# Patient Record
Sex: Female | Born: 2000
Health system: Southern US, Community
[De-identification: ages and names within clinical notes are randomized; demographics above are authoritative.]

## PROBLEM LIST (undated history)

## (undated) ENCOUNTER — Emergency Department (HOSPITAL_COMMUNITY): Admission: EM | Payer: 59

## (undated) HISTORY — PX: NO PAST SURGERIES: SHX2092

---
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
## 2021-01-08 DIAGNOSIS — Z419 Encounter for procedure for purposes other than remedying health state, unspecified: Secondary | ICD-10-CM | POA: Diagnosis not present

## 2021-02-08 DIAGNOSIS — Z419 Encounter for procedure for purposes other than remedying health state, unspecified: Secondary | ICD-10-CM | POA: Diagnosis not present

## 2021-03-10 DIAGNOSIS — Z419 Encounter for procedure for purposes other than remedying health state, unspecified: Secondary | ICD-10-CM | POA: Diagnosis not present

## 2021-04-10 DIAGNOSIS — Z419 Encounter for procedure for purposes other than remedying health state, unspecified: Secondary | ICD-10-CM | POA: Diagnosis not present

## 2021-05-05 ENCOUNTER — Other Ambulatory Visit: Payer: Self-pay

## 2021-05-05 ENCOUNTER — Encounter (HOSPITAL_BASED_OUTPATIENT_CLINIC_OR_DEPARTMENT_OTHER): Payer: Self-pay

## 2021-05-05 ENCOUNTER — Emergency Department (HOSPITAL_BASED_OUTPATIENT_CLINIC_OR_DEPARTMENT_OTHER): Payer: 59

## 2021-05-05 ENCOUNTER — Emergency Department (HOSPITAL_BASED_OUTPATIENT_CLINIC_OR_DEPARTMENT_OTHER)
Admission: EM | Admit: 2021-05-05 | Discharge: 2021-05-05 | Disposition: A | Discharge status: Home / Self Care | Payer: 59 | Attending: Emergency Medicine | Admitting: Emergency Medicine

## 2021-05-05 DIAGNOSIS — R1032 Left lower quadrant pain: Secondary | ICD-10-CM | POA: Insufficient documentation

## 2021-05-05 DIAGNOSIS — R102 Pelvic and perineal pain: Secondary | ICD-10-CM | POA: Insufficient documentation

## 2021-05-05 DIAGNOSIS — R1033 Periumbilical pain: Secondary | ICD-10-CM | POA: Insufficient documentation

## 2021-05-05 DIAGNOSIS — R112 Nausea with vomiting, unspecified: Secondary | ICD-10-CM | POA: Diagnosis not present

## 2021-05-05 DIAGNOSIS — O26891 Other specified pregnancy related conditions, first trimester: Secondary | ICD-10-CM | POA: Diagnosis not present

## 2021-05-05 DIAGNOSIS — R1031 Right lower quadrant pain: Secondary | ICD-10-CM

## 2021-05-05 DIAGNOSIS — Z3A01 Less than 8 weeks gestation of pregnancy: Secondary | ICD-10-CM | POA: Diagnosis not present

## 2021-05-05 DIAGNOSIS — O219 Vomiting of pregnancy, unspecified: Secondary | ICD-10-CM | POA: Diagnosis not present

## 2021-05-05 DIAGNOSIS — Z20822 Contact with and (suspected) exposure to covid-19: Secondary | ICD-10-CM | POA: Diagnosis not present

## 2021-05-05 DIAGNOSIS — Z3A08 8 weeks gestation of pregnancy: Secondary | ICD-10-CM | POA: Diagnosis not present

## 2021-05-05 LAB — URINALYSIS, MICROSCOPIC (REFLEX)

## 2021-05-05 LAB — COMPREHENSIVE METABOLIC PANEL
ALT: 24 U/L (ref 0–44)
AST: 22 U/L (ref 15–41)
Albumin: 4.1 g/dL (ref 3.5–5.0)
Alkaline Phosphatase: 51 U/L (ref 38–126)
Anion gap: 11 (ref 5–15)
BUN: 12 mg/dL (ref 6–20)
CO2: 20 mmol/L — ABNORMAL LOW (ref 22–32)
Calcium: 9.3 mg/dL (ref 8.9–10.3)
Chloride: 103 mmol/L (ref 98–111)
Creatinine, Ser: 0.78 mg/dL (ref 0.44–1.00)
GFR, Estimated: >60 mL/min (ref >60)
Glucose, Bld: 115 mg/dL — ABNORMAL HIGH (ref 70–99)
Potassium: 3.5 mmol/L (ref 3.5–5.1)
Sodium: 134 mmol/L — ABNORMAL LOW (ref 135–145)
Total Bilirubin: 0.5 mg/dL (ref 0.3–1.2)
Total Protein: 8.3 g/dL — ABNORMAL HIGH (ref 6.5–8.1)

## 2021-05-05 LAB — CBC WITH DIFFERENTIAL/PLATELET
Abs Immature Granulocytes: 0.06 10*3/uL (ref 0.00–0.07)
Basophils Absolute: 0 10*3/uL (ref 0.0–0.1)
Basophils Relative: 0 %
Eosinophils Absolute: 0 10*3/uL (ref 0.0–0.5)
Eosinophils Relative: 0 %
HCT: 37.7 % (ref 36.0–46.0)
Hemoglobin: 12.9 g/dL (ref 12.0–15.0)
Immature Granulocytes: 0 %
Lymphocytes Relative: 4 %
Lymphs Abs: 0.7 10*3/uL (ref 0.7–4.0)
MCH: 29.7 pg (ref 26.0–34.0)
MCHC: 34.2 g/dL (ref 30.0–36.0)
MCV: 86.7 fL (ref 80.0–100.0)
Monocytes Absolute: 0.5 10*3/uL (ref 0.1–1.0)
Monocytes Relative: 3 %
Neutro Abs: 16.4 10*3/uL — ABNORMAL HIGH (ref 1.7–7.7)
Neutrophils Relative %: 93 %
Platelets: 298 10*3/uL (ref 150–400)
RBC: 4.35 MIL/uL (ref 3.87–5.11)
RDW: 14.7 % (ref 11.5–15.5)
WBC: 17.8 10*3/uL — ABNORMAL HIGH (ref 4.0–10.5)
nRBC: 0 % (ref 0.0–0.2)

## 2021-05-05 LAB — URINALYSIS, ROUTINE W REFLEX MICROSCOPIC
Bilirubin Urine: NEGATIVE
Glucose, UA: NEGATIVE mg/dL
Hgb urine dipstick: NEGATIVE
Ketones, ur: NEGATIVE mg/dL
Leukocytes,Ua: NEGATIVE
Nitrite: NEGATIVE
Protein, ur: 100 mg/dL — AB
Specific Gravity, Urine: 1.03 (ref 1.005–1.030)
pH: 6.5 (ref 5.0–8.0)

## 2021-05-05 LAB — WET PREP, GENITAL
Sperm: NONE SEEN
Trich, Wet Prep: NONE SEEN
Yeast Wet Prep HPF POC: NONE SEEN

## 2021-05-05 LAB — RESP PANEL BY RT-PCR (FLU A&B, COVID) ARPGX2
Influenza A by PCR: NEGATIVE
Influenza B by PCR: NEGATIVE
SARS Coronavirus 2 by RT PCR: NEGATIVE

## 2021-05-05 LAB — LIPASE, BLOOD: Lipase: 22 U/L (ref 11–51)

## 2021-05-05 LAB — ABO/RH: ABO/RH(D): A POS

## 2021-05-05 LAB — HCG, QUANTITATIVE, PREGNANCY: hCG, Beta Chain, Quant, S: 234352 m[IU]/mL — ABNORMAL HIGH (ref <5)

## 2021-05-05 MED ORDER — LACTATED RINGERS IV BOLUS
1000.0000 mL | Freq: Once | INTRAVENOUS | Status: AC
  Administered 2021-05-05: 1000 mL via INTRAVENOUS

## 2021-05-05 MED ORDER — PROMETHAZINE HCL 25 MG RE SUPP: 25.0000 mg | Freq: Four times a day (QID) | RECTAL | 0 refills | Status: DC | PRN

## 2021-05-05 MED ORDER — ONDANSETRON HCL 4 MG/2ML IJ SOLN
4.0000 mg | Freq: Once | INTRAMUSCULAR | Status: AC
  Administered 2021-05-05: 4 mg via INTRAVENOUS
  Filled 2021-05-05: qty 2

## 2021-05-05 MED ORDER — FENTANYL CITRATE (PF) 100 MCG/2ML IJ SOLN
50.0000 ug | Freq: Once | INTRAMUSCULAR | Status: AC
  Administered 2021-05-05: 50 ug via INTRAVENOUS
  Filled 2021-05-05: qty 2

## 2021-05-05 MED ORDER — FENTANYL CITRATE (PF) 100 MCG/2ML IJ SOLN
25.0000 ug | Freq: Once | INTRAMUSCULAR | Status: AC
  Administered 2021-05-05: 25 ug via INTRAVENOUS
  Filled 2021-05-05: qty 2

## 2021-05-05 NOTE — Discharge Instructions (Addendum)
You are seen in the ER today for nausea and vomiting.  You were diagnosed with pregnancy.  You do have a live pregnancy inside of your uterus.  Please follow-up closely with the OB/GYN listed below.  Call them first thing Monday morning to schedule follow-up appointment.  Regarding your nausea, you may use over-the-counter B6 and Unisom for management of your nausea.  If it remains uncontrolled you may use the medication which you have been prescribed, called Phenergan suppositories.  Please use these sparingly.    You were tested for sexually transmitted infections and other vaginal infections, but desired to be discharged to follow-up with test results in the MyChart app.  Please do not listen up so that you may follow-up with your test results and to call her department or follow-up with your OB/GYN if anything test positive.  Return to ER with any new or worsening abdominal pain, nausea or vomiting does not stop, vaginal bleeding or discharge, or any other severe symptoms.

## 2021-05-05 NOTE — ED Triage Notes (Signed)
Pt c/o abd pain, n/v x 1 week-states she thinks she is pregnant

## 2021-05-05 NOTE — ED Provider Notes (Addendum)
[facility] EMERGENCY DEPARTMENT Provider Note   CSN: [id number] Arrival date & time: 05/05/21  1400     History Chief Complaint  Patient presents with   Abdominal Pain    Gloria Ochoa is a 20 y.o. female who presents with 1 week of nausea, vomiting, and 2 days of lower abdominal pain across the lower abdomen.  LMP June 2022, has not taken any pregnancy test at home.  Endorses greater than 10 episodes of NBNB emesis daily, poor p.o. tolerance, and chills and feeling feverish without having taken her temperature at home.  Denies any congestion, sore throat, cough, shortness of breath, chest pain, palpitations.  I personally reviewed this patient's medical records.  She is a G2 P0020 who was seen in the MAU in March 2022 where she was admitted for complications following elective abortion.  Additionally ask history of chronic cannabis use with cannabis hyperemesis syndrome.  HPI     History reviewed. No pertinent past medical history.  There are no problems to display for this patient.   History reviewed. No pertinent surgical history.   OB History     Gravida  2   Para      Term      Preterm      AB  1   Living         SAB      IAB  1   Ectopic      Multiple      Live Births              Family History  Problem Relation Age of Onset   Depression Mother     Social History   Tobacco Use   Smoking status: Never   Smokeless tobacco: Never  Vaping Use   Vaping Use: Every day  Substance Use Topics   Alcohol use: Yes    Comment: occ   Drug use: Yes    Types: Marijuana    Home Medications Prior to Admission medications   Medication Sig Start Date End Date Taking? Authorizing Provider  promethazine (PHENERGAN) 25 MG suppository Place 1 suppository (25 mg total) rectally every 6 (six) hours as needed for nausea or vomiting. 05/05/21  Yes Dexter Sauser R, PA-C  ibuprofen (ADVIL) 600 MG tablet Take 1 tablet (600 mg total) by  mouth every 6 (six) hours as needed for mild pain or moderate pain. 11/17/20   Rasch, Victorino DikeJennifer I, NP  ondansetron (ZOFRAN ODT) 8 MG disintegrating tablet Take 1 tablet (8 mg total) by mouth every 8 (eight) hours as needed for nausea or vomiting. 11/17/20   Rasch, Victorino DikeJennifer I, NP  oxyCODONE-acetaminophen (PERCOCET/ROXICET) 5-325 MG tablet Take 1 tablet by mouth every 6 (six) hours as needed for severe pain. 11/15/20   Donette LarryBhambri, Melanie, CNM  potassium chloride (KLOR-CON) 10 MEQ tablet Take 1 tablet (10 mEq total) by mouth daily. 11/17/20   Rasch, Harolyn RutherfordJennifer I, NP    Allergies    Patient has no known allergies.  Review of Systems   Review of Systems  Constitutional:  Positive for activity change, appetite change, chills, fatigue and fever.  HENT: Negative.    Eyes: Negative.   Respiratory: Negative.    Cardiovascular: Negative.   Gastrointestinal:  Positive for abdominal pain, nausea and vomiting. Negative for blood in stool, constipation and diarrhea.  Genitourinary:  Positive for pelvic pain. Negative for decreased urine volume, dysuria, flank pain, frequency, hematuria, vaginal bleeding, vaginal discharge and vaginal pain.  Musculoskeletal: Negative.  Skin: Negative.   Neurological: Negative.    Physical Exam Updated Vital Signs BP 108/61   Pulse 61   Temp 98.2 F (36.8 C) (Oral)   Resp 18   Wt 84.8 kg   LMP  (LMP Unknown)   SpO2 100%   BMI 27.62 kg/m   Physical Exam Vitals and nursing note reviewed. Exam conducted with a chaperone present.  Constitutional:      Appearance: She is not toxic-appearing.  HENT:     Head: Normocephalic and atraumatic.     Nose: Nose normal.     Mouth/Throat:     Mouth: Mucous membranes are moist.     Pharynx: Oropharynx is clear. Uvula midline. No oropharyngeal exudate or posterior oropharyngeal erythema.     Tonsils: No tonsillar exudate.  Eyes:     General:        Right eye: No discharge.        Left eye: No discharge.     Conjunctiva/sclera:  Conjunctivae normal.  Neck:     Trachea: Trachea and phonation normal.  Cardiovascular:     Rate and Rhythm: Normal rate and regular rhythm.     Pulses: Normal pulses.     Heart sounds: Normal heart sounds. No murmur heard. Pulmonary:     Effort: Pulmonary effort is normal. No tachypnea, bradypnea, accessory muscle usage, prolonged expiration or respiratory distress.     Breath sounds: Normal breath sounds. No wheezing or rales.  Chest:     Chest wall: No mass, lacerations, deformity, swelling, tenderness, crepitus or edema.  Abdominal:     General: Bowel sounds are normal. There is no distension.     Palpations: Abdomen is soft.     Tenderness: There is abdominal tenderness in the right lower quadrant, periumbilical area and left lower quadrant. There is no right CVA tenderness, left CVA tenderness, guarding or rebound.     Comments: Dry heaving throughout exam  Genitourinary:    General: Normal vulva.     Exam position: Lithotomy position.     Pubic Area: No rash.      Vagina: Vaginal discharge present. No bleeding.     Cervix: Normal.     Adnexa: Right adnexa normal and left adnexa normal.     Comments: Moderate amount of physiologic appearing white vaginal discharge without cervical os dilatation or adnexal fullness bilaterally.  Uterus mildly enlarged, gravid  Musculoskeletal:        General: No deformity.     Cervical back: Normal range of motion and neck supple. No edema or crepitus. No pain with movement.     Right lower leg: No edema.     Left lower leg: No edema.  Lymphadenopathy:     Cervical: No cervical adenopathy.  Skin:    General: Skin is warm and dry.     Capillary Refill: Capillary refill takes less than 2 seconds.  Neurological:     General: No focal deficit present.     Mental Status: She is alert. Mental status is at baseline.  Psychiatric:        Mood and Affect: Mood normal.    ED Results / Procedures / Treatments   Labs (all labs ordered are  listed, but only abnormal results are displayed) Labs Reviewed  COMPREHENSIVE METABOLIC PANEL - Abnormal; Notable for the following components:      Result Value   Sodium 134 (*)    CO2 20 (*)    Glucose, Bld 115 (*)    Total  Protein 8.3 (*)    All other components within normal limits  CBC WITH DIFFERENTIAL/PLATELET - Abnormal; Notable for the following components:   WBC 17.8 (*)    Neutro Abs 16.4 (*)    All other components within normal limits  HCG, QUANTITATIVE, PREGNANCY - Abnormal; Notable for the following components:   hCG, Beta Chain, Gloria Ochoa 768,115 (*)    All other components within normal limits  URINALYSIS, ROUTINE W REFLEX MICROSCOPIC - Abnormal; Notable for the following components:   APPearance CLOUDY (*)    Protein, ur 100 (*)    All other components within normal limits  URINALYSIS, MICROSCOPIC (REFLEX) - Abnormal; Notable for the following components:   Bacteria, UA RARE (*)    All other components within normal limits  RESP PANEL BY RT-PCR (FLU A&B, COVID) ARPGX2  WET PREP, GENITAL  LIPASE, BLOOD  ABO/RH  GC/CHLAMYDIA PROBE AMP () NOT AT [facility]    EKG None  Radiology US OB LESS THAN 14 WEEKS WITH OB TRANSVAGINAL  Result Date: 05/05/2021 CLINICAL DATA:  Generalized pelvic pain and cramping with nausea and vomiting for 2 days. Abortion in March with residual products, requiring intervention. EXAM: OBSTETRIC <14 WK [place] AND TRANSVAGINAL OB US DOPPLER ULTRASOUND OF OVARIES TECHNIQUE: Both transabdominal and transvaginal ultrasound examinations were performed for complete evaluation of the gestation as well as the maternal uterus, adnexal regions, and pelvic cul-de-sac. Transvaginal technique was performed to assess early pregnancy. Color and duplex Doppler ultrasound was utilized to evaluate blood flow to the ovaries. COMPARISON:  None. FINDINGS: Intrauterine gestational sac: Single Yolk sac:  Visualized. Embryo:  Visualized. Cardiac Activity: Visualized.  Heart Rate: 175 bpm CRL:   21 mm   8 w 5 d                  [place] EDC: 12/10/2021 Subchorionic hemorrhage:  None visualized. Maternal uterus/adnexae: Normal.  Left 2.9 cm corpus luteal cyst. Pulsed Doppler evaluation of both ovaries demonstrates normal appearing low-resistance arterial and venous waveforms. IMPRESSION: Viable single intrauterine pregnancy, gestational age [redacted] weeks 5 days. Normal uterus and ovaries. Electronically Signed   By: Sherron Ales M.D.   On: 05/05/2021 17:41    Procedures Procedures   Medications Ordered in ED Medications  ondansetron (ZOFRAN) injection 4 mg (4 mg Intravenous Given 05/05/21 1453)  fentaNYL (SUBLIMAZE) injection 25 mcg (25 mcg Intravenous Given 05/05/21 1453)  lactated ringers bolus 1,000 mL (0 mLs Intravenous Stopped 05/05/21 1642)  ondansetron (ZOFRAN) injection 4 mg (4 mg Intravenous Given 05/05/21 1647)  fentaNYL (SUBLIMAZE) injection 50 mcg (50 mcg Intravenous Given 05/05/21 1647)    ED Course  I have reviewed the triage vital signs and the nursing notes.  Pertinent labs & imaging results that were available during my care of the patient were reviewed by me and considered in my medical decision making (see chart for details).    MDM Rules/Calculators/A&P                         20 year old female presents with concern for abdominal pain, nausea, and vomiting X1 week.  LMP June 2022.  Differential diagnosis includes not limited to ectopic pregnancy, ovarian torsion, appendicitis, mittelschmerz, gastroenteritis, COVID-19, influenza, colitis, AAA, Crohn's disease, diverticulitis, endometriosis, PID, pyelonephritis, UTI.  Hypertensive on intake and vital signs otherwise normal.  Cardiopulmonary exam is normal, abdominal exam with tenderness upon the lower quadrants bilaterally and in the perineal area without suprapubic tenderness to palpation.  No rebound or guarding.  Will proceed with antiemetic, analgesia, and laboratory studies.  hCG positive  pregnancy with quantitative beta-hCG of 234,352.   CBC with leukocytosis of 17.8, patient chronically with elevated leukocytosis of 15-16 at baseline.  CMP with very mild hyponatremia 134, otherwise unremarkable.  Lipase is normal.  UA unremarkable.  ABO Rh is a positive, patient is on immunoglobulin candidate.  OB ultrasound revealed a single viable intrauterine pregnancy with heart rate of 175, estimated gestational age of [redacted] weeks and 5 days.  Normal uterus and ovaries.  Patient reevaluated after multiple rounds of antiemetic and analgesia with significant improvement in her nausea.  Patient appears comfortable at this time and states she is ready to go home.  After discussion with patient we will proceed with pelvic exam at her request for STI screening.  Pelvic exam as above, patient requesting to be discharged for follow-up in pelvic exam results on her MyChart app.  Will provide information for outpatient OB/GYN and recommend close outpatient follow-up.  It was discussed the patient that it is preferred that she manage her nausea with B6 and Unisom for the safety of her pregnancy; subsequently she may utilize prescribed antiemetic, though is recommend she use this sparingly and follow-up closely with her OB/GYN.  No further work-up warranted in ED at this time.  Suspect symptoms are entirely secondary to pregnancy related nausea.  Giamarie voiced understanding of her medical evaluation and treatment plan.  Each of her questions was answered to her expressed satisfaction.  Strict return precautions are given.  Patient is well-appearing, stable, and appropriate for discharge.  This chart was dictated using voice recognition software, Dragon. Despite the best efforts of this provider to proofread and correct errors, errors may still occur which can change documentation meaning.  Final Clinical Impression(s) / ED Diagnoses Final diagnoses:  RLQ abdominal pain  Nausea and vomiting in pregnancy    Rx /  DC Orders ED Discharge Orders          Ordered    promethazine (PHENERGAN) 25 MG suppository  Every 6 hours PRN        05/05/21 1904             Paris Lore, PA-C 05/05/21 1921    Yee Gangi, Gloria Gavia, PA-C 05/06/21 0138    Virgina Norfolk, DO 05/06/21 [phone]

## 2021-05-05 NOTE — ED Notes (Signed)
Patient transported to US 

## 2021-05-05 NOTE — ED Notes (Signed)
Spoke with Okey Regal in lab to add on ABO/Rh. EDP Lupe Carney is aware that this is a sendout

## 2021-05-05 NOTE — ED Notes (Signed)
Pt ambulated to bathroom with steady gait. 

## 2021-05-05 NOTE — ED Notes (Signed)
Pt aware urine specimen ordered. Pt reports inability to provide specimen at this time. Specimen collection device provided to patient. 

## 2021-05-05 NOTE — ED Notes (Signed)
Will update v/s when pt returns from [place]

## 2021-05-08 LAB — GC/CHLAMYDIA PROBE AMP (~~LOC~~) NOT AT ARMC
Chlamydia: NEGATIVE
Comment: NEGATIVE
Comment: NORMAL
Neisseria Gonorrhea: NEGATIVE

## 2021-05-10 ENCOUNTER — Emergency Department (HOSPITAL_BASED_OUTPATIENT_CLINIC_OR_DEPARTMENT_OTHER)
Admission: EM | Admit: 2021-05-10 | Discharge: 2021-05-10 | Disposition: A | Discharge status: Home / Self Care | Payer: 59 | Attending: Emergency Medicine | Admitting: Emergency Medicine

## 2021-05-10 ENCOUNTER — Encounter (HOSPITAL_BASED_OUTPATIENT_CLINIC_OR_DEPARTMENT_OTHER): Payer: Self-pay

## 2021-05-10 ENCOUNTER — Other Ambulatory Visit: Payer: Self-pay

## 2021-05-10 DIAGNOSIS — Z3A08 8 weeks gestation of pregnancy: Secondary | ICD-10-CM | POA: Diagnosis not present

## 2021-05-10 DIAGNOSIS — O210 Mild hyperemesis gravidarum: Secondary | ICD-10-CM | POA: Insufficient documentation

## 2021-05-10 DIAGNOSIS — O219 Vomiting of pregnancy, unspecified: Secondary | ICD-10-CM | POA: Diagnosis present

## 2021-05-10 LAB — COMPREHENSIVE METABOLIC PANEL
ALT: 19 U/L (ref 0–44)
AST: 20 U/L (ref 15–41)
Albumin: 3.9 g/dL (ref 3.5–5.0)
Alkaline Phosphatase: 49 U/L (ref 38–126)
Anion gap: 12 (ref 5–15)
BUN: 7 mg/dL (ref 6–20)
CO2: 20 mmol/L — ABNORMAL LOW (ref 22–32)
Calcium: 9.5 mg/dL (ref 8.9–10.3)
Chloride: 102 mmol/L (ref 98–111)
Creatinine, Ser: 0.78 mg/dL (ref 0.44–1.00)
GFR, Estimated: >60 mL/min (ref >60)
Glucose, Bld: 111 mg/dL — ABNORMAL HIGH (ref 70–99)
Potassium: 3.3 mmol/L — ABNORMAL LOW (ref 3.5–5.1)
Sodium: 134 mmol/L — ABNORMAL LOW (ref 135–145)
Total Bilirubin: 0.4 mg/dL (ref 0.3–1.2)
Total Protein: 7.5 g/dL (ref 6.5–8.1)

## 2021-05-10 LAB — CBC WITH DIFFERENTIAL/PLATELET
Abs Immature Granulocytes: 0.06 10*3/uL (ref 0.00–0.07)
Basophils Absolute: 0.1 10*3/uL (ref 0.0–0.1)
Basophils Relative: 0 %
Eosinophils Absolute: 0 10*3/uL (ref 0.0–0.5)
Eosinophils Relative: 0 %
HCT: 37.9 % (ref 36.0–46.0)
Hemoglobin: 12.9 g/dL (ref 12.0–15.0)
Immature Granulocytes: 0 %
Lymphocytes Relative: 7 %
Lymphs Abs: 1.3 10*3/uL (ref 0.7–4.0)
MCH: 29.3 pg (ref 26.0–34.0)
MCHC: 34 g/dL (ref 30.0–36.0)
MCV: 86.1 fL (ref 80.0–100.0)
Monocytes Absolute: 1 10*3/uL (ref 0.1–1.0)
Monocytes Relative: 6 %
Neutro Abs: 15.3 10*3/uL — ABNORMAL HIGH (ref 1.7–7.7)
Neutrophils Relative %: 87 %
Platelets: 270 10*3/uL (ref 150–400)
RBC: 4.4 MIL/uL (ref 3.87–5.11)
RDW: 14.3 % (ref 11.5–15.5)
WBC: 17.7 10*3/uL — ABNORMAL HIGH (ref 4.0–10.5)
nRBC: 0 % (ref 0.0–0.2)

## 2021-05-10 LAB — URINALYSIS, ROUTINE W REFLEX MICROSCOPIC
Bilirubin Urine: NEGATIVE
Glucose, UA: NEGATIVE mg/dL
Hgb urine dipstick: NEGATIVE
Ketones, ur: 40 mg/dL — AB
Leukocytes,Ua: NEGATIVE
Nitrite: NEGATIVE
Protein, ur: NEGATIVE mg/dL
Specific Gravity, Urine: 1.015 (ref 1.005–1.030)
pH: 8 (ref 5.0–8.0)

## 2021-05-10 MED ORDER — ONDANSETRON HCL 4 MG/2ML IJ SOLN
4.0000 mg | Freq: Once | INTRAMUSCULAR | Status: AC
  Administered 2021-05-10: 4 mg via INTRAVENOUS
  Filled 2021-05-10: qty 2

## 2021-05-10 MED ORDER — METOCLOPRAMIDE HCL 10 MG PO TABS: 10.0000 mg | ORAL_TABLET | Freq: Three times a day (TID) | ORAL | 0 refills | Status: DC | PRN

## 2021-05-10 MED ORDER — SODIUM CHLORIDE 0.9 % IV BOLUS
1000.0000 mL | Freq: Once | INTRAVENOUS | Status: AC
  Administered 2021-05-10: 1000 mL via INTRAVENOUS

## 2021-05-10 MED ORDER — DIPHENHYDRAMINE HCL 50 MG/ML IJ SOLN
25.0000 mg | Freq: Once | INTRAMUSCULAR | Status: AC
  Administered 2021-05-10: 25 mg via INTRAVENOUS
  Filled 2021-05-10: qty 1

## 2021-05-10 MED ORDER — DOXYLAMINE SUCCINATE (SLEEP) 25 MG PO TABS: 25.0000 mg | ORAL_TABLET | Freq: Every day | ORAL | 0 refills | Status: DC

## 2021-05-10 MED ORDER — VITAMIN B-6 50 MG PO TABS: 50.0000 mg | ORAL_TABLET | Freq: Every day | ORAL | 0 refills | Status: DC

## 2021-05-10 MED ORDER — METOCLOPRAMIDE HCL 5 MG/ML IJ SOLN
10.0000 mg | Freq: Once | INTRAMUSCULAR | Status: AC
  Administered 2021-05-10: 10 mg via INTRAVENOUS
  Filled 2021-05-10: qty 2

## 2021-05-10 MED ORDER — POTASSIUM CHLORIDE ER 10 MEQ PO TBCR: 10.0000 meq | EXTENDED_RELEASE_TABLET | Freq: Every day | ORAL | 0 refills | Status: DC

## 2021-05-10 NOTE — ED Provider Notes (Signed)
[facility] EMERGENCY DEPARTMENT Provider Note   CSN: [id number] Arrival date & time: 05/10/21  1029     History Chief Complaint  Patient presents with   Emesis During Pregnancy    Gloria Ochoa is a 20 y.o. female presenting for evaluation of nausea and vomiting.  Patient states over the past 2 days she has had increasing and worsening nausea and vomiting.  She has vomited 4 times today.  She was seen in the ER 5 days ago for the same, found out she was about [redacted] weeks pregnant and had an ultrasound that confirmed IUP.  She was treated symptomatically, felt better prior to discharge however once going home she continued to have nausea.  She has been using Phenergan suppositories as prescribed without improvement of symptoms, last dose was yesterday.  She denies fevers, chills, abdominal pain, urinary symptoms, vaginal bleeding, vaginal discharge.  She has no other medical problems, no previous abdominal surgeries.  She has had 1 previous pregnancy that ended in an elective abortion, she did have significant issues with nausea and vomiting during that other pregnancy. She has not yet established with an ob/gyn for this pregnancy.   Additional history obtained from chart review.  I reviewed recent ER visit including labs and patient's response to antiemetics and fluids.   HPI     History reviewed. No pertinent past medical history.  There are no problems to display for this patient.   History reviewed. No pertinent surgical history.   OB History     Gravida  3   Para      Term      Preterm      AB  1   Living         SAB      IAB  1   Ectopic      Multiple      Live Births              Family History  Problem Relation Age of Onset   Depression Mother     Social History   Tobacco Use   Smoking status: Never   Smokeless tobacco: Never  Vaping Use   Vaping Use: Every day  Substance Use Topics   Alcohol use: Yes    Comment: occ   Drug  use: Yes    Types: Marijuana    Home Medications Prior to Admission medications   Medication Sig Start Date End Date Taking? Authorizing Provider  doxylamine, Sleep, (UNISOM) 25 MG tablet Take 1 tablet (25 mg total) by mouth at bedtime. One tablet at bedtime on day 1; if symptoms persist on day 2, increase to 1 tablet in morning and 1 tablet at bedtime 05/10/21  Yes Azaleah Usman, PA-C  metoCLOPramide (REGLAN) 10 MG tablet Take 1 tablet (10 mg total) by mouth every 8 (eight) hours as needed for nausea. 05/10/21  Yes Anasophia Pecor, PA-C  potassium chloride (KLOR-CON) 10 MEQ tablet Take 1 tablet (10 mEq total) by mouth daily. 05/10/21  Yes Sarinah Doetsch, PA-C  pyridOXINE (VITAMIN B-6) 50 MG tablet Take 1 tablet (50 mg total) by mouth at bedtime. One tablet at bedtime on day 1; if symptoms persist on day 2, increase to 1 tablet in morning and 1 tablet at bedtime 05/10/21  Yes Brooks Kinnan, PA-C  ibuprofen (ADVIL) 600 MG tablet Take 1 tablet (600 mg total) by mouth every 6 (six) hours as needed for mild pain or moderate pain. 11/17/20   Rasch, Victorino Dike  I, NP  ondansetron (ZOFRAN ODT) 8 MG disintegrating tablet Take 1 tablet (8 mg total) by mouth every 8 (eight) hours as needed for nausea or vomiting. 11/17/20   Rasch, Victorino Dike I, NP  oxyCODONE-acetaminophen (PERCOCET/ROXICET) 5-325 MG tablet Take 1 tablet by mouth every 6 (six) hours as needed for severe pain. 11/15/20   Donette Larry, CNM  promethazine (PHENERGAN) 25 MG suppository Place 1 suppository (25 mg total) rectally every 6 (six) hours as needed for nausea or vomiting. 05/05/21   Sponseller, Eugene Gavia, PA-C    Allergies    Patient has no known allergies.  Review of Systems   Review of Systems  Gastrointestinal:  Positive for nausea and vomiting.  All other systems reviewed and are negative.  Physical Exam Updated Vital Signs BP 122/81   Pulse 80   Temp 98.4 F (36.9 C) (Oral)   Resp 16   Ht 5\' 9"  (1.753 m)   Wt 84.8  kg   LMP 03/10/2021 (Approximate)   SpO2 100%   BMI 27.62 kg/m   Physical Exam Vitals and nursing note reviewed.  Constitutional:      General: She is not in acute distress.    Appearance: Normal appearance.     Comments: nontoxic  HENT:     Head: Normocephalic and atraumatic.  Eyes:     Conjunctiva/sclera: Conjunctivae normal.     Pupils: Pupils are equal, round, and reactive to light.  Cardiovascular:     Rate and Rhythm: Normal rate and regular rhythm.     Pulses: Normal pulses.  Pulmonary:     Effort: Pulmonary effort is normal. No respiratory distress.     Breath sounds: Normal breath sounds. No wheezing.     Comments: Speaking in full sentences.  Clear lung sounds in all fields. Abdominal:     General: There is no distension.     Palpations: Abdomen is soft. There is no mass.     Tenderness: There is no abdominal tenderness. There is no guarding or rebound.     Comments: No TTP of the abdomen.  No rigidity or distention.  Musculoskeletal:        General: Normal range of motion.     Cervical back: Normal range of motion and neck supple.  Skin:    General: Skin is warm and dry.     Capillary Refill: Capillary refill takes less than 2 seconds.  Neurological:     Mental Status: She is alert and oriented to person, place, and time.  Psychiatric:        Mood and Affect: Mood and affect normal.        Speech: Speech normal.        Behavior: Behavior normal.    ED Results / Procedures / Treatments   Labs (all labs ordered are listed, but only abnormal results are displayed) Labs Reviewed  COMPREHENSIVE METABOLIC PANEL - Abnormal; Notable for the following components:      Result Value   Sodium 134 (*)    Potassium 3.3 (*)    CO2 20 (*)    Glucose, Bld 111 (*)    All other components within normal limits  CBC WITH DIFFERENTIAL/PLATELET - Abnormal; Notable for the following components:   WBC 17.7 (*)    Neutro Abs 15.3 (*)    All other components within normal  limits  URINALYSIS, ROUTINE W REFLEX MICROSCOPIC - Abnormal; Notable for the following components:   APPearance CLOUDY (*)    Ketones, ur 40 (*)  All other components within normal limits    EKG None  Radiology No results found.  Procedures Procedures   Medications Ordered in ED Medications  ondansetron (ZOFRAN) injection 4 mg (4 mg Intravenous Given 05/10/21 1148)  sodium chloride 0.9 % bolus 1,000 mL (0 mLs Intravenous Stopped 05/10/21 1300)  metoCLOPramide (REGLAN) injection 10 mg (10 mg Intravenous Given 05/10/21 1226)  diphenhydrAMINE (BENADRYL) injection 25 mg (25 mg Intravenous Given 05/10/21 1226)  ondansetron (ZOFRAN) injection 4 mg (4 mg Intravenous Given 05/10/21 1434)    ED Course  I have reviewed the triage vital signs and the nursing notes.  Pertinent labs & imaging results that were available during my care of the patient were reviewed by me and considered in my medical decision making (see chart for details).    MDM Rules/Calculators/A&P                           Patient resenting for evaluation of nausea and vomiting.  On exam, patient appears nontoxic.  She is no tenderness palpation of the abdomen.  In the setting of early pregnancy, likely hyperemesis gravidarum.  However patient also smokes marijuana, consider hyperemesis cannabinoid syndrome, though less likely as patient does not have significant pain.  Also consider other causes including GERD, gallbladder etiology, viral GI illness.  Will obtain labs, treat symptomatically, and reassess.  Labs interpreted by me, shows leukocytosis of 17, however this is similar to several days ago.  Per chart review, she persistently has elevated white count.  Mild hypokalemia at 3.3, can replenish orally.  Otherwise electrolytes are reassuring and kidney function is normal.  Urine without signs of infection.  On reevaluation after fluids and antiemetics, patient reports symptoms are improved, however she continues to have  nausea.  Will treat further and reassess.  Patient tolerating p.o. without difficulty.  Discussed continued symptomatic management at home.  Encourage close follow-up with OB/GYN.  At this time, patient appears safe for discharge.  Return precautions given.  Patient states she understands and agrees to plan.   Final Clinical Impression(s) / ED Diagnoses Final diagnoses:  Hyperemesis gravidarum    Rx / DC Orders ED Discharge Orders          Ordered    doxylamine, Sleep, (UNISOM) 25 MG tablet  Daily at bedtime        05/10/21 1418    pyridOXINE (VITAMIN B-6) 50 MG tablet  Daily at bedtime        05/10/21 1418    metoCLOPramide (REGLAN) 10 MG tablet  Every 8 hours PRN        05/10/21 1418    potassium chloride (KLOR-CON) 10 MEQ tablet  Daily        05/10/21 1420             Kyreese Chio, PA-C 05/10/21 1516    Derwood Kaplan, MD 05/11/21 [phone]

## 2021-05-10 NOTE — ED Triage Notes (Addendum)
Pt state she has been having N/V x 2 days. Seen here for same on 8/26. Positive pregnancy test. Been taking phenergan suppositories without relief

## 2021-05-10 NOTE — Discharge Instructions (Addendum)
Take combination of Unisom and vitamin B6 at nighttime, and if symptoms or not improving increase to at nighttime and in the morning.  This will help control your nausea and vomiting. Continue to use Reglan tablets and your Phenergan suppositories as needed for breakthrough nausea and vomiting. Make sure you stay well-hydrated with water. Take potassium for the next 5 days as prescribed. Follow-up with OB/GYN, you may call either or both of the clinics listed below to set up a follow-up appointment. Return to the emergency room with any new, worsening, or concerning symptoms

## 2021-05-10 NOTE — ED Notes (Addendum)
Patient reports vomiting x 2 days, recent positive pregnancy test and seen here for same on 8-26

## 2021-05-11 DIAGNOSIS — Z419 Encounter for procedure for purposes other than remedying health state, unspecified: Secondary | ICD-10-CM | POA: Diagnosis not present

## 2021-05-31 ENCOUNTER — Emergency Department (HOSPITAL_BASED_OUTPATIENT_CLINIC_OR_DEPARTMENT_OTHER)
Admission: EM | Admit: 2021-05-31 | Discharge: 2021-05-31 | Disposition: A | Discharge status: Home / Self Care | Payer: 59 | Attending: Emergency Medicine | Admitting: Emergency Medicine

## 2021-05-31 ENCOUNTER — Encounter (HOSPITAL_BASED_OUTPATIENT_CLINIC_OR_DEPARTMENT_OTHER): Payer: Self-pay | Admitting: Emergency Medicine

## 2021-05-31 ENCOUNTER — Other Ambulatory Visit: Payer: Self-pay

## 2021-05-31 ENCOUNTER — Other Ambulatory Visit (HOSPITAL_BASED_OUTPATIENT_CLINIC_OR_DEPARTMENT_OTHER): Payer: Self-pay

## 2021-05-31 DIAGNOSIS — O219 Vomiting of pregnancy, unspecified: Secondary | ICD-10-CM | POA: Insufficient documentation

## 2021-05-31 DIAGNOSIS — D72829 Elevated white blood cell count, unspecified: Secondary | ICD-10-CM | POA: Diagnosis not present

## 2021-05-31 DIAGNOSIS — Z3A01 Less than 8 weeks gestation of pregnancy: Secondary | ICD-10-CM | POA: Diagnosis not present

## 2021-05-31 DIAGNOSIS — O99891 Other specified diseases and conditions complicating pregnancy: Secondary | ICD-10-CM

## 2021-05-31 DIAGNOSIS — R8271 Bacteriuria: Secondary | ICD-10-CM | POA: Insufficient documentation

## 2021-05-31 DIAGNOSIS — Z20822 Contact with and (suspected) exposure to covid-19: Secondary | ICD-10-CM | POA: Diagnosis not present

## 2021-05-31 DIAGNOSIS — E876 Hypokalemia: Secondary | ICD-10-CM | POA: Insufficient documentation

## 2021-05-31 DIAGNOSIS — Z3A00 Weeks of gestation of pregnancy not specified: Secondary | ICD-10-CM | POA: Insufficient documentation

## 2021-05-31 DIAGNOSIS — O2391 Unspecified genitourinary tract infection in pregnancy, first trimester: Secondary | ICD-10-CM | POA: Diagnosis not present

## 2021-05-31 LAB — RESP PANEL BY RT-PCR (FLU A&B, COVID) ARPGX2
Influenza A by PCR: NEGATIVE
Influenza B by PCR: NEGATIVE
SARS Coronavirus 2 by RT PCR: NEGATIVE

## 2021-05-31 LAB — CBC WITH DIFFERENTIAL/PLATELET
Abs Immature Granulocytes: 0.06 10*3/uL (ref 0.00–0.07)
Basophils Absolute: 0 10*3/uL (ref 0.0–0.1)
Basophils Relative: 0 %
Eosinophils Absolute: 0 10*3/uL (ref 0.0–0.5)
Eosinophils Relative: 0 %
HCT: 37.1 % (ref 36.0–46.0)
Hemoglobin: 12.8 g/dL (ref 12.0–15.0)
Immature Granulocytes: 0 %
Lymphocytes Relative: 9 %
Lymphs Abs: 1.5 10*3/uL (ref 0.7–4.0)
MCH: 29.2 pg (ref 26.0–34.0)
MCHC: 34.5 g/dL (ref 30.0–36.0)
MCV: 84.5 fL (ref 80.0–100.0)
Monocytes Absolute: 1 10*3/uL (ref 0.1–1.0)
Monocytes Relative: 6 %
Neutro Abs: 14.6 10*3/uL — ABNORMAL HIGH (ref 1.7–7.7)
Neutrophils Relative %: 85 %
Platelets: 273 10*3/uL (ref 150–400)
RBC: 4.39 MIL/uL (ref 3.87–5.11)
RDW: 13.4 % (ref 11.5–15.5)
WBC: 17.2 10*3/uL — ABNORMAL HIGH (ref 4.0–10.5)
nRBC: 0 % (ref 0.0–0.2)

## 2021-05-31 LAB — BASIC METABOLIC PANEL
Anion gap: 10 (ref 5–15)
BUN: 7 mg/dL (ref 6–20)
CO2: 20 mmol/L — ABNORMAL LOW (ref 22–32)
Calcium: 9.3 mg/dL (ref 8.9–10.3)
Chloride: 103 mmol/L (ref 98–111)
Creatinine, Ser: 0.74 mg/dL (ref 0.44–1.00)
GFR, Estimated: >60 mL/min (ref >60)
Glucose, Bld: 116 mg/dL — ABNORMAL HIGH (ref 70–99)
Potassium: 3.1 mmol/L — ABNORMAL LOW (ref 3.5–5.1)
Sodium: 133 mmol/L — ABNORMAL LOW (ref 135–145)

## 2021-05-31 LAB — URINALYSIS, ROUTINE W REFLEX MICROSCOPIC
Bilirubin Urine: NEGATIVE
Glucose, UA: >=500 mg/dL — AB
Hgb urine dipstick: NEGATIVE
Ketones, ur: 15 mg/dL — AB
Nitrite: NEGATIVE
Protein, ur: NEGATIVE mg/dL
Specific Gravity, Urine: 1.015 (ref 1.005–1.030)
pH: 7.5 (ref 5.0–8.0)

## 2021-05-31 LAB — URINALYSIS, MICROSCOPIC (REFLEX)

## 2021-05-31 LAB — CBG MONITORING, ED: Glucose-Capillary: 103 mg/dL — ABNORMAL HIGH (ref 70–99)

## 2021-05-31 MED ORDER — FAMOTIDINE IN NACL 20-0.9 MG/50ML-% IV SOLN
20.0000 mg | Freq: Once | INTRAVENOUS | Status: AC
  Administered 2021-05-31: 20 mg via INTRAVENOUS
  Filled 2021-05-31: qty 50

## 2021-05-31 MED ORDER — METOCLOPRAMIDE HCL 5 MG/ML IJ SOLN
5.0000 mg | Freq: Once | INTRAMUSCULAR | Status: AC
  Administered 2021-05-31: 5 mg via INTRAVENOUS
  Filled 2021-05-31: qty 2

## 2021-05-31 MED ORDER — CEPHALEXIN 500 MG PO CAPS
500.0000 mg | ORAL_CAPSULE | Freq: Two times a day (BID) | ORAL | 0 refills | Status: AC
  Filled 2021-05-31: qty 14, 7d supply, fill #0

## 2021-05-31 MED ORDER — POTASSIUM CHLORIDE 10 MEQ/100ML IV SOLN
10.0000 meq | Freq: Once | INTRAVENOUS | Status: AC
  Administered 2021-05-31: 10 meq via INTRAVENOUS
  Filled 2021-05-31: qty 100

## 2021-05-31 MED ORDER — ONDANSETRON 4 MG PO TBDP
4.0000 mg | ORAL_TABLET | Freq: Three times a day (TID) | ORAL | 0 refills | Status: DC | PRN
  Filled 2021-05-31: qty 10, 4d supply, fill #0

## 2021-05-31 MED ORDER — DOXYLAMINE-PYRIDOXINE 10-10 MG PO TBEC
1.0000 | DELAYED_RELEASE_TABLET | Freq: Every evening | ORAL | 0 refills | Status: DC
  Filled 2021-05-31: qty 30, 30d supply, fill #0

## 2021-05-31 MED ORDER — ONDANSETRON HCL 4 MG/2ML IJ SOLN
4.0000 mg | Freq: Once | INTRAMUSCULAR | Status: AC
  Administered 2021-05-31: 4 mg via INTRAVENOUS
  Filled 2021-05-31: qty 2

## 2021-05-31 MED ORDER — SUCRALFATE 1 GM/10ML PO SUSP
1.0000 g | Freq: Once | ORAL | Status: AC
  Administered 2021-05-31: 1 g via ORAL
  Filled 2021-05-31: qty 10

## 2021-05-31 MED ORDER — DEXTROSE 5 % AND 0.9 % NACL IV BOLUS
1000.0000 mL | Freq: Once | INTRAVENOUS | Status: AC
  Administered 2021-05-31: 1000 mL via INTRAVENOUS

## 2021-05-31 MED ORDER — ONDANSETRON 4 MG PO TBDP
4.0000 mg | ORAL_TABLET | Freq: Once | ORAL | Status: AC
  Administered 2021-05-31: 4 mg via ORAL
  Filled 2021-05-31: qty 1

## 2021-05-31 MED ORDER — DIPHENHYDRAMINE HCL 50 MG/ML IJ SOLN
25.0000 mg | Freq: Once | INTRAMUSCULAR | Status: AC
  Administered 2021-05-31: 25 mg via INTRAVENOUS
  Filled 2021-05-31: qty 1

## 2021-05-31 NOTE — ED Notes (Signed)
Patient Alert and oriented to baseline. Stable and ambulatory to baseline. Patient verbalized understanding of the discharge instructions.  Patient belongings were taken by the patient.   

## 2021-05-31 NOTE — ED Notes (Signed)
Pt provided ice water per pt request.

## 2021-05-31 NOTE — ED Provider Notes (Signed)
[facility] EMERGENCY DEPARTMENT Provider Note   CSN: [id number] Arrival date & time: 05/31/21  5809     History Chief Complaint  Patient presents with   Morning Sickness    Gloria Ochoa is a 20 y.o. female.  20 yo female with history as below, she is pregnant G3P2, around 3 mos gestation.  Reports ongoing nausea and vomiting over the past 3 days.  Reduced oral intake.  Last time she was able to keep food down was yesterday afternoon.  Unable to tolerate liquids or solids past 12 hours.  Denies fevers or chills.  No diarrhea.  No abdominal pain.  No vaginal pain, no vaginal bleeding or cramping.  No contraction-like sensation.  No Urination changes.  No recent travel or sick contacts.  No suspicious oral intake.  Patient does report history of morning sickness in previous pregnancies, was taking doxylamine/B6 which has not significant improved her symptoms.  In the past she has taken Zofran, Reglan but not recently.  The history is provided by the patient. No language interpreter was used.  Emesis Severity:  Mild Duration:  3 days Timing:  Intermittent Quality:  Undigested food and stomach contents Progression:  Unchanged Chronicity:  Recurrent Recent urination:  Normal Context: not post-tussive   Relieved by:  Nothing Worsened by:  Food smell Ineffective treatments:  Antiemetics Associated symptoms: chills   Associated symptoms: no abdominal pain, no arthralgias, no cough, no diarrhea, no fever, no headaches, no myalgias, no sore throat and no URI       History reviewed. No pertinent past medical history.  There are no problems to display for this patient.   History reviewed. No pertinent surgical history.   OB History     Gravida  3   Para      Term      Preterm      AB  1   Living         SAB      IAB  1   Ectopic      Multiple      Live Births              Family History  Problem Relation Age of Onset   Depression Mother      Social History   Tobacco Use   Smoking status: Never   Smokeless tobacco: Never  Vaping Use   Vaping Use: Every day  Substance Use Topics   Alcohol use: Yes    Comment: occ   Drug use: Yes    Types: Marijuana    Home Medications Prior to Admission medications   Medication Sig Start Date End Date Taking? Authorizing Provider  cephALEXin (KEFLEX) 500 MG capsule Take 1 capsule (500 mg total) by mouth 2 (two) times daily for 7 days. 05/31/21 06/07/21 Yes Tanda Rockers A, DO  Doxylamine-Pyridoxine 10-10 MG TBEC Take 1 tablet by mouth at bedtime. 05/31/21  Yes Tanda Rockers A, DO  ondansetron (ZOFRAN ODT) 4 MG disintegrating tablet Take 1 tablet (4 mg total) by mouth every 8 (eight) hours as needed for nausea or vomiting. 05/31/21  Yes Sloan Leiter, DO  doxylamine, Sleep, (UNISOM) 25 MG tablet Take 1 tablet (25 mg total) by mouth at bedtime. One tablet at bedtime on day 1; if symptoms persist on day 2, increase to 1 tablet in morning and 1 tablet at bedtime 05/10/21   Caccavale, Sophia, PA-C  ibuprofen (ADVIL) 600 MG tablet Take 1 tablet (600 mg total) by  mouth every 6 (six) hours as needed for mild pain or moderate pain. 11/17/20   Rasch, Victorino Dike I, NP  metoCLOPramide (REGLAN) 10 MG tablet Take 1 tablet (10 mg total) by mouth every 8 (eight) hours as needed for nausea. 05/10/21   Caccavale, Sophia, PA-C  ondansetron (ZOFRAN ODT) 8 MG disintegrating tablet Take 1 tablet (8 mg total) by mouth every 8 (eight) hours as needed for nausea or vomiting. 11/17/20   Rasch, Victorino Dike I, NP  oxyCODONE-acetaminophen (PERCOCET/ROXICET) 5-325 MG tablet Take 1 tablet by mouth every 6 (six) hours as needed for severe pain. 11/15/20   Donette Larry, CNM  potassium chloride (KLOR-CON) 10 MEQ tablet Take 1 tablet (10 mEq total) by mouth daily. 05/10/21   Caccavale, Sophia, PA-C  promethazine (PHENERGAN) 25 MG suppository Place 1 suppository (25 mg total) rectally every 6 (six) hours as needed for nausea or vomiting.  05/05/21   Sponseller, Lupe Carney R, PA-C  pyridOXINE (VITAMIN B-6) 50 MG tablet Take 1 tablet (50 mg total) by mouth at bedtime. One tablet at bedtime on day 1; if symptoms persist on day 2, increase to 1 tablet in morning and 1 tablet at bedtime 05/10/21   Caccavale, Sophia, PA-C    Allergies    Patient has no known allergies.  Review of Systems   Review of Systems  Constitutional:  Positive for chills. Negative for fever.  HENT:  Negative for facial swelling, sore throat and trouble swallowing.   Eyes:  Negative for photophobia and visual disturbance.  Respiratory:  Negative for cough and shortness of breath.   Cardiovascular:  Negative for chest pain and palpitations.  Gastrointestinal:  Positive for nausea and vomiting. Negative for abdominal pain and diarrhea.  Endocrine: Negative for polydipsia and polyuria.  Genitourinary:  Negative for difficulty urinating and hematuria.  Musculoskeletal:  Negative for arthralgias, gait problem, joint swelling and myalgias.  Skin:  Negative for pallor and rash.  Neurological:  Negative for syncope and headaches.  Psychiatric/Behavioral:  Negative for agitation and confusion.    Physical Exam Updated Vital Signs BP 136/81   Pulse 95   Temp 98.6 F (37 C) (Oral)   Resp 20   Ht 5\' 9"  (1.753 m)   Wt 84.8 kg   LMP 03/10/2021 (Approximate)   SpO2 100%   BMI 27.62 kg/m   Physical Exam Vitals and nursing note reviewed.  Constitutional:      General: She is not in acute distress.    Appearance: Normal appearance. She is not ill-appearing.  HENT:     Head: Normocephalic and atraumatic.     Right Ear: External ear normal.     Left Ear: External ear normal.     Nose: Nose normal.     Mouth/Throat:     Mouth: Mucous membranes are dry.  Eyes:     General: No scleral icterus.       Right eye: No discharge.        Left eye: No discharge.  Cardiovascular:     Rate and Rhythm: Normal rate and regular rhythm.     Pulses: Normal pulses.      Heart sounds: Normal heart sounds.  Pulmonary:     Effort: Pulmonary effort is normal. No respiratory distress.     Breath sounds: Normal breath sounds.  Abdominal:     General: Abdomen is flat. There is no distension.     Tenderness: There is no abdominal tenderness.  Musculoskeletal:        General: Normal range  of motion.     Cervical back: Normal range of motion.     Right lower leg: No edema.     Left lower leg: No edema.  Skin:    General: Skin is warm and dry.     Capillary Refill: Capillary refill takes less than 2 seconds.  Neurological:     Mental Status: She is alert.  Psychiatric:        Mood and Affect: Mood normal.        Behavior: Behavior normal.    ED Results / Procedures / Treatments   Labs (all labs ordered are listed, but only abnormal results are displayed) Labs Reviewed  BASIC METABOLIC PANEL - Abnormal; Notable for the following components:      Result Value   Sodium 133 (*)    Potassium 3.1 (*)    CO2 20 (*)    Glucose, Bld 116 (*)    All other components within normal limits  CBC WITH DIFFERENTIAL/PLATELET - Abnormal; Notable for the following components:   WBC 17.2 (*)    Neutro Abs 14.6 (*)    All other components within normal limits  URINALYSIS, ROUTINE W REFLEX MICROSCOPIC - Abnormal; Notable for the following components:   APPearance CLOUDY (*)    Glucose, UA >=500 (*)    Ketones, ur 15 (*)    Leukocytes,Ua MODERATE (*)    All other components within normal limits  URINALYSIS, MICROSCOPIC (REFLEX) - Abnormal; Notable for the following components:   Bacteria, UA MANY (*)    All other components within normal limits  CBG MONITORING, ED - Abnormal; Notable for the following components:   Glucose-Capillary 103 (*)    All other components within normal limits  RESP PANEL BY RT-PCR (FLU A&B, COVID) [id number]  URINE CULTURE    EKG None  Radiology No results found.  Procedures Procedures   Medications Ordered in ED Medications   ondansetron (ZOFRAN-ODT) disintegrating tablet 4 mg (has no administration in time range)  dextrose 5 % and 0.9% NaCl 5-0.9 % bolus 1,000 mL (1,000 mLs Intravenous New Bag/Given 05/31/21 1007)  ondansetron (ZOFRAN) injection 4 mg (4 mg Intravenous Given 05/31/21 1007)  metoCLOPramide (REGLAN) injection 5 mg (5 mg Intravenous Given 05/31/21 1052)  diphenhydrAMINE (BENADRYL) injection 25 mg (25 mg Intravenous Given 05/31/21 1052)  potassium chloride 10 mEq in 100 mL IVPB (10 mEq Intravenous New Bag/Given 05/31/21 1114)  famotidine (PEPCID) IVPB 20 mg premix (20 mg Intravenous New Bag/Given 05/31/21 1149)  sucralfate (CARAFATE) 1 GM/10ML suspension 1 g (1 g Oral Given 05/31/21 1209)    ED Course  I have reviewed the triage vital signs and the nursing notes.  Pertinent labs & imaging results that were available during my care of the patient were reviewed by me and considered in my medical decision making (see chart for details).    MDM Rules/Calculators/A&P                         This patient complains of N/V; this involves an extensive number of treatment options and is a complaint that carries with it a high risk of complications and morbidity. Serious etiologies considered.   I ordered medication zofran, reglan, benadryl, IVF, pepcid  Previous records obtained and reviewed    12:40PM Labs reviewed mild hypokalemia.  Replaced orally.  Mild leukocytosis, likely reactive in setting of vomiting.  No fever.  Doubt sepsis.  Urinalysis with leukocytes, ketones, many bacteria and contamination.  Given  pregnant we will treat with oral antibiotic, asymptomatic bacteriuria.  Patient given D5 NS as she has ketones in her urine, nausea and vomiting.  Urine Culture sent.   Patient reassessed, reports that she is feeling much better.  She is ambulatory.  She is tolerant to oral intake.  No further vomiting.  She is asking to leave.  She has appoint with her OB/GYN in the morning.  Given resolution of  symptoms and reassuring laboratory evaluation this is reasonable.  Discussed strict return precautions with patient. Bland diet. Medications for symptom control.       The patient improved significantly and was discharged in stable condition. Detailed discussions were had with the patient regarding current findings, and need for close f/u with PCP or on call doctor. The patient has been instructed to return immediately if the symptoms worsen in any way for re-evaluation. Patient verbalized understanding and is in agreement with current care plan. All questions answered prior to discharge.    Final Clinical Impression(s) / ED Diagnoses Final diagnoses:  Nausea and vomiting in pregnancy  Asymptomatic bacteriuria during pregnancy    Rx / DC Orders ED Discharge Orders          Ordered    ondansetron (ZOFRAN ODT) 4 MG disintegrating tablet  Every 8 hours PRN        05/31/21 1247    Doxylamine-Pyridoxine 10-10 MG TBEC  Nightly        05/31/21 1247    cephALEXin (KEFLEX) 500 MG capsule  2 times daily        05/31/21 1249             Sloan Leiter, DO 05/31/21 1252

## 2021-05-31 NOTE — ED Triage Notes (Signed)
Reports nausea and vomiting for the last 5 days.  Is 3 months pregnant.  Medications from previous visit not working per patient.

## 2021-06-01 ENCOUNTER — Telehealth (INDEPENDENT_AMBULATORY_CARE_PROVIDER_SITE_OTHER): Payer: 59

## 2021-06-01 ENCOUNTER — Other Ambulatory Visit (HOSPITAL_BASED_OUTPATIENT_CLINIC_OR_DEPARTMENT_OTHER): Payer: Self-pay

## 2021-06-01 DIAGNOSIS — Z3490 Encounter for supervision of normal pregnancy, unspecified, unspecified trimester: Secondary | ICD-10-CM

## 2021-06-01 DIAGNOSIS — Z3A00 Weeks of gestation of pregnancy not specified: Secondary | ICD-10-CM

## 2021-06-01 LAB — URINE CULTURE

## 2021-06-01 NOTE — Progress Notes (Signed)
New OB Intake  I connected with  Gloria Ochoa on 06/01/21 at 11:15 AM EDT by telephone Video Visit and verified that I am speaking with the correct person using two identifiers. Nurse is located at [facility] and pt is located at home.  I discussed the limitations, risks, security and privacy concerns of performing an evaluation and management service by telephone and the availability of in person appointments. I also discussed with the patient that there may be a patient responsible charge related to this service. The patient expressed understanding and agreed to proceed.  I explained I am completing New OB Intake today. We discussed her EDD of 12/15/2021 that is based on LMP of 03/10/2021. Pt is G2/P0. I reviewed her allergies, medications, Medical/Surgical/OB history, and appropriate screenings. I informed her of [facility] services. Based on history, this is a/an  pregnancy uncomplicated .    Concerns addressed today- no concerns at this time.  Patient advised to contact the office with any questions or concerns. Pt verbalized understanding.   Delivery Plans:  Plans to deliver at [facility].   MyChart/Babyscripts MyChart access verified. I explained pt will have some visits in office and some virtually. Babyscripts instructions given and order placed. Patient verifies receipt of registration text/e-mail. Account successfully created and app downloaded.  Blood Pressure Cuff  Patient has private insurance; instructed to purchase blood pressure cuff and bring to first prenatal appt. Explained after first prenatal appt pt will check weekly and document in Babyscripts.  Weight scale: Patient has weight scale at home.   Anatomy US Explained first scheduled [place] will be around 19 weeks. Anatomy US scheduled for November at 0845.   Labs Discussed Avelina Laine genetic screening with patient. Would like both Panorama and Horizon drawn at new OB visit. Routine prenatal labs needed.  Covid Vaccine Patient has not  covid vaccine.   Mother/ Baby Dyad Candidate?   Patient is a candidate.  Patient has stated that she would love to receive her prenatal care through the Mom/Baby Dyad  If yes, offer as possibility  Informed patient of [facility] website  and placed link in her AVS.   Social Determinants of Health Food Insecurity: Patient denies food insecurity. WIC Referral: Patient is not interested in referral to [facility].  Transportation: Patient denies transportation needs. Childcare: Discussed no children allowed at ultrasound appointments. Offered childcare services; patient declines childcare services at this time.     Placed OB Box on problem list and updated  First visit review I reviewed new OB appt with pt. I explained she will have a pelvic exam, ob bloodwork with genetic screening, and PAP smear. Explained pt will be seen by Mom/Baby Dyad at first visit; encounter routed to appropriate provider. Explained that patient will be seen by pregnancy navigator following visit with provider. [facility] information placed in AVS.   Ralene Bathe, RN 06/01/2021  11:42 AM

## 2021-06-10 DIAGNOSIS — Z419 Encounter for procedure for purposes other than remedying health state, unspecified: Secondary | ICD-10-CM | POA: Diagnosis not present

## 2021-06-15 ENCOUNTER — Encounter: Payer: 59 | Admitting: Student

## 2021-06-16 ENCOUNTER — Other Ambulatory Visit: Payer: Self-pay

## 2021-06-16 ENCOUNTER — Other Ambulatory Visit (HOSPITAL_COMMUNITY)
Admission: RE | Admit: 2021-06-16 | Discharge: 2021-06-16 | Disposition: A | Discharge status: Home / Self Care | Payer: 59 | Source: Ambulatory Visit | Attending: Student | Admitting: Student

## 2021-06-16 ENCOUNTER — Ambulatory Visit (INDEPENDENT_AMBULATORY_CARE_PROVIDER_SITE_OTHER): Payer: 59 | Admitting: Family Medicine

## 2021-06-16 VITALS — BP 117/78 | HR 76 | Wt 181.1 lb

## 2021-06-16 DIAGNOSIS — Z3492 Encounter for supervision of normal pregnancy, unspecified, second trimester: Secondary | ICD-10-CM | POA: Diagnosis not present

## 2021-06-16 DIAGNOSIS — Z315 Encounter for genetic counseling: Secondary | ICD-10-CM | POA: Diagnosis not present

## 2021-06-16 DIAGNOSIS — Z3482 Encounter for supervision of other normal pregnancy, second trimester: Secondary | ICD-10-CM | POA: Diagnosis not present

## 2021-06-16 DIAGNOSIS — O219 Vomiting of pregnancy, unspecified: Secondary | ICD-10-CM

## 2021-06-16 DIAGNOSIS — Z3A14 14 weeks gestation of pregnancy: Secondary | ICD-10-CM

## 2021-06-16 MED ORDER — DOXYLAMINE SUCCINATE (SLEEP) 25 MG PO TABS: 25.0000 mg | ORAL_TABLET | Freq: Every day | ORAL | 0 refills | Status: DC

## 2021-06-16 MED ORDER — PROMETHAZINE HCL 25 MG PO TABS: 25.0000 mg | ORAL_TABLET | Freq: Four times a day (QID) | ORAL | 1 refills | Status: DC | PRN

## 2021-06-16 NOTE — Progress Notes (Signed)
   Mom-Baby Dyad New OB Visit   Subjective:   Gloria Ochoa is a 20 y.o. G2P0010 at [redacted]w[redacted]d by early ultrasound being seen today for her first obstetrical visit.  Her obstetrical history is significant for  n/a . Patient does not intend to breast feed. Pregnancy history fully reviewed.  Patient reports no complaints.  HISTORY: OB History  Gravida Para Term Preterm AB Living  2 0 0 0 1 0  SAB IAB Ectopic Multiple Live Births  0 1 0 0 0    # Outcome Date GA Lbr Len/2nd Weight Sex Delivery Anes PTL Lv  2 Current           1 IAB 11/11/20 [redacted]w[redacted]d            Last pap smear: No results found for: DIAGPAP, HPV, HPVHIGH N/a  History reviewed. No pertinent past medical history. History reviewed. No pertinent surgical history. Family History  Problem Relation Age of Onset   Depression Mother    Social History   Tobacco Use   Smoking status: Never   Smokeless tobacco: Never  Vaping Use   Vaping Use: Former   Devices: quit a couple weeks ago  Substance Use Topics   Alcohol use: Yes    Comment: occ   Drug use: Yes    Types: Marijuana   No Known Allergies No current outpatient medications on file prior to visit.   No current facility-administered medications on file prior to visit.     Exam   Vitals:   06/16/21 0931  BP: 117/78  Pulse: 76  Weight: 210 lb (95.3 kg)   Fetal Heart Rate (bpm): 154  System: General: well-developed, well-nourished female in no acute distress   Skin: normal coloration and turgor, no rashes   Neurologic: oriented, normal, negative, normal mood   Extremities: normal strength, tone, and muscle mass, ROM of all joints is normal   HEENT PERRLA, extraocular movement intact and sclera clear, anicteric   Neck supple and no masses   Respiratory:  no respiratory distress      Assessment:   Pregnancy: G2P0010 Patient Active Problem List   Diagnosis Date Noted   Supervision of low-risk pregnancy 06/01/2021     Plan:  1. Encounter for  supervision of low-risk pregnancy in second trimester Initial labs drawn. Continue prenatal vitamins. Trial unisom and phenergan for ongoing nausea Genetic Screening discussed, NIPS: ordered. Ultrasound discussed; fetal anatomic survey: ordered. Problem list reviewed and updated. The nature of Dyad/Family Care clinic was explained to patient; Voiced they may need to be seen by other [facility] providers which includes family medicine physicians, OB GYNs, and APPs. Delivery will hopefully be with one of the Dyad providers or another [facility] Medicine physician and we cannot promise this at this time.  Discussed there are [facility] staff in the hospital 24-7 and they understand and support this model and there is a likelihood one of these providers will catch their baby.  We also discussed that the service includes learners (residents, student) and they will be involved in the care team.  Routine obstetric precautions reviewed. Return in about 4 weeks (around 07/14/2021) for Dyad patient, ob visit.

## 2021-06-17 LAB — CBC/D/PLT+RPR+RH+ABO+RUBIGG...
Antibody Screen: NEGATIVE
Basophils Absolute: 0 10*3/uL (ref 0.0–0.2)
Basos: 0 %
EOS (ABSOLUTE): 0.1 10*3/uL (ref 0.0–0.4)
Eos: 1 %
HCV Ab: <0.1 s/co ratio (ref 0.0–0.9)
HIV Screen 4th Generation wRfx: NONREACTIVE
Hematocrit: 37.9 % (ref 34.0–46.6)
Hemoglobin: 12.7 g/dL (ref 11.1–15.9)
Hepatitis B Surface Ag: NEGATIVE
Immature Grans (Abs): 0 10*3/uL (ref 0.0–0.1)
Immature Granulocytes: 0 %
Lymphocytes Absolute: 1.5 10*3/uL (ref 0.7–3.1)
Lymphs: 12 %
MCH: 29.7 pg (ref 26.6–33.0)
MCHC: 33.5 g/dL (ref 31.5–35.7)
MCV: 89 fL (ref 79–97)
Monocytes Absolute: 0.8 10*3/uL (ref 0.1–0.9)
Monocytes: 7 %
Neutrophils Absolute: 9.7 10*3/uL — ABNORMAL HIGH (ref 1.4–7.0)
Neutrophils: 80 %
Platelets: 290 10*3/uL (ref 150–450)
RBC: 4.27 x10E6/uL (ref 3.77–5.28)
RDW: 13.3 % (ref 11.7–15.4)
RPR Ser Ql: NONREACTIVE
Rh Factor: POSITIVE
Rubella Antibodies, IGG: 7.37 index (ref >0.99)
WBC: 12.3 10*3/uL — ABNORMAL HIGH (ref 3.4–10.8)

## 2021-06-17 LAB — HEMOGLOBIN A1C
Est. average glucose Bld gHb Est-mCnc: 100 mg/dL
Hgb A1c MFr Bld: 5.1 % (ref 4.8–5.6)

## 2021-06-17 LAB — HCV INTERPRETATION

## 2021-06-18 LAB — CULTURE, OB URINE

## 2021-06-18 LAB — URINE CULTURE, OB REFLEX

## 2021-06-19 LAB — GC/CHLAMYDIA PROBE AMP (~~LOC~~) NOT AT ARMC
Chlamydia: NEGATIVE
Comment: NEGATIVE
Comment: NORMAL
Neisseria Gonorrhea: NEGATIVE

## 2021-07-02 DIAGNOSIS — O26892 Other specified pregnancy related conditions, second trimester: Secondary | ICD-10-CM | POA: Diagnosis not present

## 2021-07-02 DIAGNOSIS — R1084 Generalized abdominal pain: Secondary | ICD-10-CM | POA: Diagnosis not present

## 2021-07-02 DIAGNOSIS — R1030 Lower abdominal pain, unspecified: Secondary | ICD-10-CM | POA: Diagnosis not present

## 2021-07-02 DIAGNOSIS — D72829 Elevated white blood cell count, unspecified: Secondary | ICD-10-CM | POA: Diagnosis not present

## 2021-07-02 DIAGNOSIS — Z3A17 17 weeks gestation of pregnancy: Secondary | ICD-10-CM | POA: Diagnosis not present

## 2021-07-02 DIAGNOSIS — O219 Vomiting of pregnancy, unspecified: Secondary | ICD-10-CM | POA: Diagnosis not present

## 2021-07-03 ENCOUNTER — Emergency Department (HOSPITAL_BASED_OUTPATIENT_CLINIC_OR_DEPARTMENT_OTHER)
Admission: EM | Admit: 2021-07-03 | Discharge: 2021-07-03 | Disposition: A | Discharge status: Home / Self Care | Payer: 59 | Attending: Emergency Medicine | Admitting: Emergency Medicine

## 2021-07-03 ENCOUNTER — Other Ambulatory Visit: Payer: Self-pay

## 2021-07-03 ENCOUNTER — Encounter (HOSPITAL_BASED_OUTPATIENT_CLINIC_OR_DEPARTMENT_OTHER): Payer: Self-pay | Admitting: Emergency Medicine

## 2021-07-03 DIAGNOSIS — Z3A17 17 weeks gestation of pregnancy: Secondary | ICD-10-CM | POA: Insufficient documentation

## 2021-07-03 DIAGNOSIS — E876 Hypokalemia: Secondary | ICD-10-CM | POA: Diagnosis not present

## 2021-07-03 DIAGNOSIS — Z3A15 15 weeks gestation of pregnancy: Secondary | ICD-10-CM | POA: Diagnosis not present

## 2021-07-03 DIAGNOSIS — O210 Mild hyperemesis gravidarum: Secondary | ICD-10-CM | POA: Diagnosis not present

## 2021-07-03 LAB — URINALYSIS, MICROSCOPIC (REFLEX)

## 2021-07-03 LAB — CBC WITH DIFFERENTIAL/PLATELET
Abs Immature Granulocytes: 0.05 10*3/uL (ref 0.00–0.07)
Basophils Absolute: 0 10*3/uL (ref 0.0–0.1)
Basophils Relative: 0 %
Eosinophils Absolute: 0 10*3/uL (ref 0.0–0.5)
Eosinophils Relative: 0 %
HCT: 35 % — ABNORMAL LOW (ref 36.0–46.0)
Hemoglobin: 12.3 g/dL (ref 12.0–15.0)
Immature Granulocytes: 0 %
Lymphocytes Relative: 15 %
Lymphs Abs: 2.2 10*3/uL (ref 0.7–4.0)
MCH: 29.6 pg (ref 26.0–34.0)
MCHC: 35.1 g/dL (ref 30.0–36.0)
MCV: 84.3 fL (ref 80.0–100.0)
Monocytes Absolute: 1.3 10*3/uL — ABNORMAL HIGH (ref 0.1–1.0)
Monocytes Relative: 9 %
Neutro Abs: 10.9 10*3/uL — ABNORMAL HIGH (ref 1.7–7.7)
Neutrophils Relative %: 76 %
Platelets: 288 10*3/uL (ref 150–400)
RBC: 4.15 MIL/uL (ref 3.87–5.11)
RDW: 12.9 % (ref 11.5–15.5)
WBC: 14.6 10*3/uL — ABNORMAL HIGH (ref 4.0–10.5)
nRBC: 0 % (ref 0.0–0.2)

## 2021-07-03 LAB — COMPREHENSIVE METABOLIC PANEL
ALT: 41 U/L (ref 0–44)
AST: 33 U/L (ref 15–41)
Albumin: 4.1 g/dL (ref 3.5–5.0)
Alkaline Phosphatase: 46 U/L (ref 38–126)
Anion gap: 14 (ref 5–15)
BUN: 8 mg/dL (ref 6–20)
CO2: 18 mmol/L — ABNORMAL LOW (ref 22–32)
Calcium: 9.8 mg/dL (ref 8.9–10.3)
Chloride: 102 mmol/L (ref 98–111)
Creatinine, Ser: 0.72 mg/dL (ref 0.44–1.00)
GFR, Estimated: >60 mL/min (ref >60)
Glucose, Bld: 109 mg/dL — ABNORMAL HIGH (ref 70–99)
Potassium: 2.9 mmol/L — ABNORMAL LOW (ref 3.5–5.1)
Sodium: 134 mmol/L — ABNORMAL LOW (ref 135–145)
Total Bilirubin: 1.2 mg/dL (ref 0.3–1.2)
Total Protein: 7.5 g/dL (ref 6.5–8.1)

## 2021-07-03 LAB — URINALYSIS, ROUTINE W REFLEX MICROSCOPIC
Glucose, UA: 100 mg/dL — AB
Hgb urine dipstick: NEGATIVE
Ketones, ur: >=80 mg/dL — AB
Leukocytes,Ua: NEGATIVE
Nitrite: POSITIVE — AB
Protein, ur: 100 mg/dL — AB
Specific Gravity, Urine: 1.03 (ref 1.005–1.030)
pH: 6.5 (ref 5.0–8.0)

## 2021-07-03 MED ORDER — POTASSIUM CHLORIDE ER 10 MEQ PO TBCR: 20.0000 meq | EXTENDED_RELEASE_TABLET | Freq: Every day | ORAL | 0 refills | Status: DC

## 2021-07-03 MED ORDER — ACETAMINOPHEN 325 MG PO TABS
650.0000 mg | ORAL_TABLET | Freq: Once | ORAL | Status: AC
  Administered 2021-07-03: 650 mg via ORAL
  Filled 2021-07-03: qty 2

## 2021-07-03 MED ORDER — ONDANSETRON 4 MG PO TBDP: 4.0000 mg | ORAL_TABLET | Freq: Three times a day (TID) | ORAL | 0 refills | Status: DC | PRN

## 2021-07-03 MED ORDER — PROMETHAZINE HCL 25 MG/ML IJ SOLN
INTRAMUSCULAR | Status: AC
  Filled 2021-07-03: qty 1

## 2021-07-03 MED ORDER — SODIUM CHLORIDE 0.9 % IV BOLUS
1000.0000 mL | Freq: Once | INTRAVENOUS | Status: AC
  Administered 2021-07-03: 1000 mL via INTRAVENOUS

## 2021-07-03 MED ORDER — SODIUM CHLORIDE 0.9 % IV SOLN
12.5000 mg | Freq: Once | INTRAVENOUS | Status: AC
  Administered 2021-07-03: 12.5 mg via INTRAVENOUS
  Filled 2021-07-03: qty 0.5

## 2021-07-03 MED ORDER — ONDANSETRON HCL 4 MG/2ML IJ SOLN
4.0000 mg | Freq: Once | INTRAMUSCULAR | Status: AC
  Administered 2021-07-03: 4 mg via INTRAVENOUS
  Filled 2021-07-03: qty 2

## 2021-07-03 NOTE — ED Triage Notes (Signed)
Diffuse abd pain , emesis x 3 days , restless during triage in obvious distress. Last BM yesterday

## 2021-07-03 NOTE — ED Provider Notes (Signed)
[facility] EMERGENCY DEPARTMENT Provider Note   CSN: [id number] Arrival date & time: 07/03/21  8127     History Chief Complaint  Patient presents with   Abdominal Pain    Gloria Ochoa is a 20 y.o. female.  Presenting to the ER with concern for nausea, vomiting.  Patient reports that she is currently [redacted] weeks gestation.  Per review of chart, 10/23 ultrasound performed at [facility] showed IUP, fetus had fetal heart rate of 150 bpm.  Estimated gestational age was 17 weeks and 0 days.  Patient reports that she has had really bad nausea and vomiting throughout the pregnancy.  Worse this morning.  No blood in vomit.  HPI     History reviewed. No pertinent past medical history.  Patient Active Problem List   Diagnosis Date Noted   Supervision of low-risk pregnancy 06/01/2021    History reviewed. No pertinent surgical history.   OB History     Gravida  2   Para      Term      Preterm      AB  1   Living  0      SAB      IAB  1   Ectopic      Multiple      Live Births              Family History  Problem Relation Age of Onset   Depression Mother     Social History   Tobacco Use   Smoking status: Never   Smokeless tobacco: Never  Vaping Use   Vaping Use: Former   Devices: quit a couple weeks ago  Substance Use Topics   Alcohol use: Yes    Comment: occ   Drug use: Yes    Types: Marijuana    Home Medications Prior to Admission medications   Medication Sig Start Date End Date Taking? Authorizing Provider  ondansetron (ZOFRAN ODT) 4 MG disintegrating tablet Take 1 tablet (4 mg total) by mouth every 8 (eight) hours as needed for nausea or vomiting. 07/03/21  Yes Avenir Lozinski, Quitman Livings, MD  potassium chloride (KLOR-CON) 10 MEQ tablet Take 2 tablets (20 mEq total) by mouth daily. 07/03/21  Yes Milagros Loll, MD  doxylamine, Sleep, (UNISOM) 25 MG tablet Take 1 tablet (25 mg total) by mouth at bedtime. One tablet at bedtime  on day 1; if symptoms persist on day 2, increase to 1 tablet in morning and 1 tablet at bedtime 06/16/21   Venora Maples, MD  promethazine (PHENERGAN) 25 MG tablet Take 1 tablet (25 mg total) by mouth every 6 (six) hours as needed for nausea or vomiting. 06/16/21   Venora Maples, MD    Allergies    Patient has no known allergies.  Review of Systems   Review of Systems  Constitutional:  Negative for chills and fever.  HENT:  Negative for ear pain and sore throat.   Eyes:  Negative for pain and visual disturbance.  Respiratory:  Negative for cough and shortness of breath.   Cardiovascular:  Negative for chest pain and palpitations.  Gastrointestinal:  Positive for nausea and vomiting. Negative for abdominal pain.  Genitourinary:  Negative for dysuria and hematuria.  Musculoskeletal:  Negative for arthralgias and back pain.  Skin:  Negative for color change and rash.  Neurological:  Negative for seizures and syncope.  All other systems reviewed and are negative.  Physical Exam Updated Vital Signs BP 136/81  Pulse 94   Temp 98.2 F (36.8 C) (Oral)   Resp 20   Ht 5\' 9"  (1.753 m)   Wt 82.1 kg   LMP 03/10/2021 (Approximate)   SpO2 100%   BMI 26.73 kg/m   Physical Exam Vitals and nursing note reviewed.  Constitutional:      General: She is not in acute distress.    Appearance: She is well-developed.  HENT:     Head: Normocephalic and atraumatic.  Eyes:     Conjunctiva/sclera: Conjunctivae normal.  Cardiovascular:     Rate and Rhythm: Normal rate and regular rhythm.     Heart sounds: No murmur heard. Pulmonary:     Effort: Pulmonary effort is normal. No respiratory distress.     Breath sounds: Normal breath sounds.  Abdominal:     Palpations: Abdomen is soft.     Tenderness: There is no abdominal tenderness.  Musculoskeletal:     Cervical back: Neck supple.  Skin:    General: Skin is warm and dry.  Neurological:     Mental Status: She is alert.    ED  Results / Procedures / Treatments   Labs (all labs ordered are listed, but only abnormal results are displayed) Labs Reviewed  CBC WITH DIFFERENTIAL/PLATELET - Abnormal; Notable for the following components:      Result Value   WBC 14.6 (*)    HCT 35.0 (*)    Neutro Abs 10.9 (*)    Monocytes Absolute 1.3 (*)    All other components within normal limits  COMPREHENSIVE METABOLIC PANEL - Abnormal; Notable for the following components:   Sodium 134 (*)    Potassium 2.9 (*)    CO2 18 (*)    Glucose, Bld 109 (*)    All other components within normal limits  URINALYSIS, ROUTINE W REFLEX MICROSCOPIC - Abnormal; Notable for the following components:   Color, Urine AMBER (*)    APPearance CLOUDY (*)    Glucose, UA 100 (*)    Bilirubin Urine MODERATE (*)    Ketones, ur >=80 (*)    Protein, ur 100 (*)    Nitrite POSITIVE (*)    All other components within normal limits  URINALYSIS, MICROSCOPIC (REFLEX) - Abnormal; Notable for the following components:   Bacteria, UA FEW (*)    All other components within normal limits    EKG None  Radiology No results found.  Procedures Procedures   Medications Ordered in ED Medications  promethazine (PHENERGAN) 25 MG/ML injection (has no administration in time range)  sodium chloride 0.9 % bolus 1,000 mL (0 mLs Intravenous Stopped 07/03/21 1319)  ondansetron (ZOFRAN) injection 4 mg (4 mg Intravenous Given 07/03/21 1014)  acetaminophen (TYLENOL) tablet 650 mg (650 mg Oral Given 07/03/21 1048)  promethazine (PHENERGAN) 12.5 mg in sodium chloride 0.9 % 50 mL IVPB (0 mg Intravenous Stopped 07/03/21 1320)    ED Course  I have reviewed the triage vital signs and the nursing notes.  Pertinent labs & imaging results that were available during my care of the patient were reviewed by me and considered in my medical decision making (see chart for details).    MDM Rules/Calculators/A&P                          20 year old presenting to ER with  concern for nausea vomiting.  [redacted] weeks pregnant.  Ultrasound yesterday showed IUP at [redacted] weeks EGA.  Today, patient initially appeared uncomfortable but not in  distress.  Abdomen soft.  Her basic labs were noted for mildly low bicarb level, hypokalemia.  Suspect dehydration.  Suspect underlying hyperemesis gravidarum.  Patient was provided symptomatic control with fluids and Zofran.  Had improvement in symptoms and then had some worsening nausea that responded well to promethazine.  Patient was tolerating p.o. without difficulty and vital signs are within normal limits.  At present believe she is stable for discharge.  Stressed need to establish care with local OB/GYN.  Reviewed return precautions and discharged.  After the discussed management above, the patient was determined to be safe for discharge.  The patient was in agreement with this plan and all questions regarding their care were answered.  ED return precautions were discussed and the patient will return to the ED with any significant worsening of condition.   Final Clinical Impression(s) / ED Diagnoses Final diagnoses:  Hypokalemia  Hyperemesis gravidarum    Rx / DC Orders ED Discharge Orders          Ordered    ondansetron (ZOFRAN ODT) 4 MG disintegrating tablet  Every 8 hours PRN        07/03/21 1236    potassium chloride (KLOR-CON) 10 MEQ tablet  Daily        07/03/21 1236             Milagros Loll, MD 07/03/21 1431

## 2021-07-03 NOTE — ED Notes (Signed)
Ot unable to void at this time

## 2021-07-03 NOTE — Discharge Instructions (Signed)
We recommended remaining in the emergency room for further observation and treatment.  If you change your mind or if you have worsening vomiting or pain, please come back to ER for reassessment.  Recommend following up with your gynecologist.  If you do not have one, can contact the number provided to get established.

## 2021-07-11 DIAGNOSIS — Z419 Encounter for procedure for purposes other than remedying health state, unspecified: Secondary | ICD-10-CM | POA: Diagnosis not present

## 2021-07-18 ENCOUNTER — Ambulatory Visit (INDEPENDENT_AMBULATORY_CARE_PROVIDER_SITE_OTHER): Payer: 59 | Admitting: Family Medicine

## 2021-07-18 ENCOUNTER — Other Ambulatory Visit: Payer: Self-pay

## 2021-07-18 VITALS — BP 119/75 | HR 103 | Wt 175.0 lb

## 2021-07-18 DIAGNOSIS — Z3492 Encounter for supervision of normal pregnancy, unspecified, second trimester: Secondary | ICD-10-CM | POA: Diagnosis not present

## 2021-07-18 NOTE — Progress Notes (Signed)
   Subjective:  Gloria Ochoa is a 20 y.o. G2P0010 at [redacted]w[redacted]d being seen today for ongoing prenatal care.  She is currently monitored for the following issues for this low-risk pregnancy and has Supervision of low-risk pregnancy on their problem list.  Patient reports no complaints.  Contractions: Not present. Vag. Bleeding: None.  Movement: Absent. Denies leaking of fluid.   The following portions of the patient's history were reviewed and updated as appropriate: allergies, current medications, past family history, past medical history, past social history, past surgical history and problem list. Problem list updated.  Objective:   Vitals:   07/18/21 1604  BP: 119/75  Pulse: (!) 103  Weight: 175 lb (79.4 kg)    Fetal Status: Fetal Heart Rate (bpm): 145   Movement: Absent     General:  Alert, oriented and cooperative. Patient is in no acute distress.  Skin: Skin is warm and dry. No rash noted.   Cardiovascular: Normal heart rate noted  Respiratory: Normal respiratory effort, no problems with respiration noted  Abdomen: Soft, gravid, appropriate for gestational age. Pain/Pressure: Absent     Pelvic: Vag. Bleeding: None     Cervical exam deferred        Extremities: Normal range of motion.  Edema: None  Mental Status: Normal mood and affect. Normal behavior. Normal judgment and thought content.   Urinalysis:      Assessment and Plan:  Pregnancy: G2P0010 at [redacted]w[redacted]d  1. Encounter for supervision of low-risk pregnancy in second trimester BP and FHR normal AFP today  Preterm labor symptoms and general obstetric precautions including but not limited to vaginal bleeding, contractions, leaking of fluid and fetal movement were reviewed in detail with the patient. Please refer to After Visit Summary for other counseling recommendations.  Return in 4 weeks (on 08/15/2021) for Dyad patient, ob visit.   Venora Maples, MD

## 2021-07-18 NOTE — Patient Instructions (Signed)

## 2021-07-20 LAB — AFP, SERUM, OPEN SPINA BIFIDA
AFP MoM: 1.03
AFP Value: 49.3 ng/mL
Gest. Age on Collection Date: 18.4 weeks
Maternal Age At EDD: 19.3 yr
OSBR Risk 1 IN: 10000
Test Results:: NEGATIVE
Weight: 175 [lb_av]

## 2021-07-21 ENCOUNTER — Other Ambulatory Visit: Payer: Self-pay

## 2021-07-21 ENCOUNTER — Other Ambulatory Visit: Payer: Self-pay | Admitting: Student

## 2021-07-21 ENCOUNTER — Ambulatory Visit: Payer: 59 | Attending: Student

## 2021-07-21 ENCOUNTER — Other Ambulatory Visit: Payer: Self-pay | Admitting: *Deleted

## 2021-07-21 DIAGNOSIS — Z3A19 19 weeks gestation of pregnancy: Secondary | ICD-10-CM

## 2021-07-21 DIAGNOSIS — O35EXX0 Maternal care for other (suspected) fetal abnormality and damage, fetal genitourinary anomalies, not applicable or unspecified: Secondary | ICD-10-CM

## 2021-07-21 DIAGNOSIS — Z3490 Encounter for supervision of normal pregnancy, unspecified, unspecified trimester: Secondary | ICD-10-CM | POA: Diagnosis not present

## 2021-07-21 DIAGNOSIS — Z3689 Encounter for other specified antenatal screening: Secondary | ICD-10-CM | POA: Diagnosis not present

## 2021-07-27 ENCOUNTER — Encounter: Payer: Self-pay | Admitting: Student

## 2021-07-27 DIAGNOSIS — Q639 Congenital malformation of kidney, unspecified: Secondary | ICD-10-CM | POA: Insufficient documentation

## 2021-08-10 DIAGNOSIS — Z419 Encounter for procedure for purposes other than remedying health state, unspecified: Secondary | ICD-10-CM | POA: Diagnosis not present

## 2021-08-21 ENCOUNTER — Telehealth (INDEPENDENT_AMBULATORY_CARE_PROVIDER_SITE_OTHER): Payer: 59 | Admitting: Family Medicine

## 2021-08-21 DIAGNOSIS — Z3492 Encounter for supervision of normal pregnancy, unspecified, second trimester: Secondary | ICD-10-CM

## 2021-08-21 NOTE — Progress Notes (Signed)
    TELEHEALTH OBSTETRICS VISIT ENCOUNTER NOTE  Provider location: Center for [facility]   Patient location: Home  I connected with Gloria Ochoa on 08/21/21 at  8:15 AM EST by telephone at home and verified that I am speaking with the correct person using two identifiers. Of note, unable to do video encounter due to technical difficulties.    I discussed the limitations, risks, security and privacy concerns of performing an evaluation and management service by telephone and the availability of in person appointments. I also discussed with the patient that there may be a patient responsible charge related to this service. The patient expressed understanding and agreed to proceed.  Subjective:  Gloria Ochoa is a 20 y.o. G2P0010 at [redacted]w[redacted]d being followed for ongoing prenatal care.  She is currently monitored for the following issues for this low-risk pregnancy and has Supervision of low-risk pregnancy and Renal anomaly on their problem list.  Patient reports no complaints. Reports fetal movement. Denies any contractions, bleeding or leaking of fluid.   The following portions of the patient's history were reviewed and updated as appropriate: allergies, current medications, past family history, past medical history, past social history, past surgical history and problem list.   Objective:  Last menstrual period 03/10/2021. General:  Alert, oriented and cooperative.   Mental Status: Normal mood and affect perceived. Normal judgment and thought content.  Rest of physical exam deferred due to type of encounter  Assessment and Plan:  Pregnancy: G2P0010 at [redacted]w[redacted]d  1. Encounter for supervision of low-risk pregnancy in second trimester Up to date No concerns today Has repeat US due to UTD   Preterm labor symptoms and general obstetric precautions including but not limited to vaginal bleeding, contractions, leaking of fluid and fetal movement were reviewed in  detail with the patient.   I discussed the assessment and treatment plan with the patient. The patient was provided an opportunity to ask questions and all were answered. The patient agreed with the plan and demonstrated an understanding of the instructions. The patient was advised to call back or seek an in-person office evaluation/go to MAU at [facility] for any urgent or concerning symptoms. Please refer to After Visit Summary for other counseling recommendations.   I provided 10 minutes of non-face-to-face time during this encounter.  Return in about 4 weeks (around 09/18/2021) for Routine prenatal care, Dual Care-MB Dyad.  Future Appointments  Date Time Provider Department Center  09/18/2021  8:15 AM [facility]  09/18/2021  8:20 AM [facility]  10/10/2021  9:55 AM [facility]ABYDYAD [facility]  10/13/2021  9:15 AM [facility] US2 [facility]    Federico Flake, MD Center for [employer], [facility]

## 2021-09-10 DIAGNOSIS — O99113 Other diseases of the blood and blood-forming organs and certain disorders involving the immune mechanism complicating pregnancy, third trimester: Secondary | ICD-10-CM | POA: Diagnosis not present

## 2021-09-10 DIAGNOSIS — Z3A00 Weeks of gestation of pregnancy not specified: Secondary | ICD-10-CM | POA: Diagnosis not present

## 2021-09-10 DIAGNOSIS — D72829 Elevated white blood cell count, unspecified: Secondary | ICD-10-CM | POA: Diagnosis not present

## 2021-09-10 DIAGNOSIS — R109 Unspecified abdominal pain: Secondary | ICD-10-CM | POA: Diagnosis not present

## 2021-09-10 DIAGNOSIS — Z419 Encounter for procedure for purposes other than remedying health state, unspecified: Secondary | ICD-10-CM | POA: Diagnosis not present

## 2021-09-10 DIAGNOSIS — O212 Late vomiting of pregnancy: Secondary | ICD-10-CM | POA: Diagnosis not present

## 2021-09-10 DIAGNOSIS — O99119 Other diseases of the blood and blood-forming organs and certain disorders involving the immune mechanism complicating pregnancy, unspecified trimester: Secondary | ICD-10-CM | POA: Diagnosis not present

## 2021-09-10 DIAGNOSIS — O219 Vomiting of pregnancy, unspecified: Secondary | ICD-10-CM | POA: Diagnosis not present

## 2021-09-10 DIAGNOSIS — E860 Dehydration: Secondary | ICD-10-CM | POA: Diagnosis not present

## 2021-09-10 DIAGNOSIS — Z3689 Encounter for other specified antenatal screening: Secondary | ICD-10-CM | POA: Diagnosis not present

## 2021-09-10 DIAGNOSIS — Z3A32 32 weeks gestation of pregnancy: Secondary | ICD-10-CM | POA: Diagnosis not present

## 2021-09-10 DIAGNOSIS — O26893 Other specified pregnancy related conditions, third trimester: Secondary | ICD-10-CM | POA: Diagnosis not present

## 2021-09-10 DIAGNOSIS — O99283 Endocrine, nutritional and metabolic diseases complicating pregnancy, third trimester: Secondary | ICD-10-CM | POA: Diagnosis not present

## 2021-09-10 NOTE — L&D Delivery Note (Signed)
Delivery Note ?Gloria Ochoa is a 21 y.o. G2P0010 at [redacted]w[redacted]d admitted for IOL d/t GHTN, NRNST, early labor.  ? ?GBS Status: Negative/-- (03/13 1307) ?Maximum Maternal Temperature: 98.0 ? ?Labor course: Initial SVE: 2/60/-2. Augmentation with: AROM and Pitocin. She then progressed to complete.  ?ROM: 4h 46m with clear fluid ? ?Birth: At 0305 a viable female was delivered via spontaneous vaginal delivery (Presentation: LOA). Nuchal cord present: No.  Shoulders and body delivered in usual fashion. Infant placed directly on mom's abdomen for bonding/skin-to-skin, baby dried and stimulated. Cord clamped x 2 after 1 minute and cut by FOB.  Cord blood collected.  The placenta separated spontaneously and delivered via gentle cord traction.  Pitocin infused rapidly IV per protocol.  Fundus firm with massage.  ?Placenta inspected and appears to be intact with a 3 VC.  Placenta/Cord with the following complications: none .  Cord pH: not done. Fundus boggy, firmed slightly w/ massage, then would ooze, clots removed from LUS, then boggy again- gave cytotec buccal/438mcg rectal, removed more clots from LUS, boggy- TXA given. Fundus firmed up, @ U.  ?Sponge and instrument count were correct x2. ? ?Intrapartum complications:  None ?Anesthesia:  epidural ?Episiotomy: none ?Lacerations:  Lt periurethral/labial ?Suture Repair: 3.0 vicryl ?EBL (mL): 250 ? ? ?Infant: ?APGAR (1 MIN): 8   ?APGAR (5 MINS): 9   ?APGAR (10 MINS):    ?Infant weight: pending ? ?Mom to postpartum.  Baby to Couplet care / Skin to Skin. Placenta to Pathology for [facility]    ?Plans to Breastfeed ?Contraception: Nexplanon ?Circumcision: N/A ? ?Note sent to [facility]: [facility] for pp visit. ? ?Cheral Marker CNM, WHNP-BC ?12/13/2021 ?3:32 AM ? ?  ?

## 2021-09-12 ENCOUNTER — Telehealth: Payer: Self-pay | Admitting: Family Medicine

## 2021-09-12 MED ORDER — PROMETHAZINE HCL 25 MG PO TABS: 25.0000 mg | ORAL_TABLET | Freq: Four times a day (QID) | ORAL | 0 refills | Status: DC | PRN

## 2021-09-12 MED ORDER — ONDANSETRON 4 MG PO TBDP: 4.0000 mg | ORAL_TABLET | Freq: Three times a day (TID) | ORAL | 0 refills | Status: DC | PRN

## 2021-09-12 NOTE — Telephone Encounter (Signed)
Call placed back to pt. Spoke with pt. Pt states went to ER at [facility] on 09/10/21. Pt states was dehydrated and wanted to know if needs to be seen earlier than scheduled appt. Pt states has +FM,  denies vaginal bleeding or leaking of fluid. Pt had last vomit last night at 7pm. Has only drank apple juice today with Rx Potassium. Pt has not eaten today. Reviewed information with Dr Crissie Reese. Pt advised to hydrate and to normalize eating. Ok to keep scheduled appt on 09/18/21. Pt also sent in Rx for Phenergan and Zofran ODT. Pt also advised if unable to keep drink or food down, then return to MAU. Pt verbalized understanding.  Laney Pastor

## 2021-09-12 NOTE — Telephone Encounter (Signed)
Patient called stating she had went to the emergency department at [facility] for dehydration and has not felt well since, she is concerned waiting until her appointment on the 9th will be too long and wanted to speak with a nurse about it.

## 2021-09-14 ENCOUNTER — Other Ambulatory Visit: Payer: Self-pay

## 2021-09-14 DIAGNOSIS — Z3490 Encounter for supervision of normal pregnancy, unspecified, unspecified trimester: Secondary | ICD-10-CM

## 2021-09-18 ENCOUNTER — Telehealth: Payer: Self-pay | Admitting: Family Medicine

## 2021-09-18 ENCOUNTER — Other Ambulatory Visit: Payer: 59

## 2021-09-18 NOTE — Telephone Encounter (Signed)
Patient state she can't come to her appointment today, want a nurse to call her back

## 2021-09-18 NOTE — Telephone Encounter (Signed)
Call placed back to pt. Spoke with pt. Pt needing to reschedule appt that was missed today.  Pt scheduled for OB appt with 28 week labs and 2 hr GTT on 09/25/21 at 910am. Pt aware of fasting labs and OB appt. Pt verbalized understanding to date and time of appt.  Laney Pastor

## 2021-09-25 ENCOUNTER — Other Ambulatory Visit: Payer: Self-pay

## 2021-09-25 ENCOUNTER — Ambulatory Visit (INDEPENDENT_AMBULATORY_CARE_PROVIDER_SITE_OTHER): Payer: 59 | Admitting: Family Medicine

## 2021-09-25 ENCOUNTER — Other Ambulatory Visit: Payer: 59

## 2021-09-25 VITALS — BP 104/73 | HR 87 | Wt 193.7 lb

## 2021-09-25 DIAGNOSIS — Z3490 Encounter for supervision of normal pregnancy, unspecified, unspecified trimester: Secondary | ICD-10-CM

## 2021-09-25 DIAGNOSIS — Z3492 Encounter for supervision of normal pregnancy, unspecified, second trimester: Secondary | ICD-10-CM

## 2021-09-25 DIAGNOSIS — Z23 Encounter for immunization: Secondary | ICD-10-CM | POA: Diagnosis not present

## 2021-09-25 MED ORDER — PRENATAL 27-1 MG PO TABS: 1.0000 | ORAL_TABLET | Freq: Every day | ORAL | 10 refills | Status: DC

## 2021-09-25 NOTE — Progress Notes (Signed)
° °  PRENATAL VISIT NOTE  Subjective:  Gloria Ochoa is a 21 y.o. G2P0010 at [redacted]w[redacted]d being seen today for ongoing prenatal care.  She is currently monitored for the following issues for this low-risk pregnancy and has Supervision of low-risk pregnancy and Renal anomaly on their problem list.  Patient reports no complaints.  Contractions: Not present. Vag. Bleeding: None.  Movement: Present. Denies leaking of fluid.   The following portions of the patient's history were reviewed and updated as appropriate: allergies, current medications, past family history, past medical history, past social history, past surgical history and problem list.   Objective:   Vitals:   09/25/21 0939  BP: 104/73  Pulse: 87  Weight: 193 lb 11.2 oz (87.9 kg)    Fetal Status: Fetal Heart Rate (bpm): 149   Movement: Present     General:  Alert, oriented and cooperative. Patient is in no acute distress.  Skin: Skin is warm and dry. No rash noted.   Cardiovascular: Normal heart rate noted  Respiratory: Normal respiratory effort, no problems with respiration noted  Abdomen: Soft, gravid, appropriate for gestational age.  Pain/Pressure: Absent     Pelvic: Cervical exam deferred        Extremities: Normal range of motion.  Edema: None  Mental Status: Normal mood and affect. Normal behavior. Normal judgment and thought content.   Assessment and Plan:  Pregnancy: G2P0010 at [redacted]w[redacted]d 1. Encounter for supervision of low-risk pregnancy in second trimester 28 wk labs today - Prenatal 27-1 MG TABS; Take 1 tablet by mouth daily.  Dispense: 30 tablet; Refill: 10 - Tdap vaccine greater than or equal to 7yo IM  Preterm labor symptoms and general obstetric precautions including but not limited to vaginal bleeding, contractions, leaking of fluid and fetal movement were reviewed in detail with the patient. Please refer to After Visit Summary for other counseling recommendations.   No follow-ups on file.  Future Appointments   Date Time Provider Department Center  10/03/2021  9:35 AM [facility]  10/13/2021  9:15 AM [facility] US2 [facility]  10/18/2021  2:55 PM [facility]  11/03/2021 10:55 AM [facility]  11/20/2021 10:55 AM [facility]  11/27/2021  9:55 AM [facility]  12/05/2021  1:55 PM [facility]    Federico Flake, MD

## 2021-09-26 LAB — RPR: RPR Ser Ql: NONREACTIVE

## 2021-09-26 LAB — GLUCOSE TOLERANCE, 2 HOURS W/ 1HR
Glucose, 1 hour: 115 mg/dL (ref 70–179)
Glucose, 2 hour: 77 mg/dL (ref 70–152)
Glucose, Fasting: 87 mg/dL (ref 70–91)

## 2021-09-26 LAB — ANTIBODY SCREEN: Antibody Screen: NEGATIVE

## 2021-09-26 LAB — CBC
Hematocrit: 33 % — ABNORMAL LOW (ref 34.0–46.6)
Hemoglobin: 11 g/dL — ABNORMAL LOW (ref 11.1–15.9)
MCH: 29.4 pg (ref 26.6–33.0)
MCHC: 33.3 g/dL (ref 31.5–35.7)
MCV: 88 fL (ref 79–97)
Platelets: 270 10*3/uL (ref 150–450)
RBC: 3.74 x10E6/uL — ABNORMAL LOW (ref 3.77–5.28)
RDW: 12.2 % (ref 11.7–15.4)
WBC: 14.4 10*3/uL — ABNORMAL HIGH (ref 3.4–10.8)

## 2021-09-26 LAB — HIV ANTIBODY (ROUTINE TESTING W REFLEX): HIV Screen 4th Generation wRfx: NONREACTIVE

## 2021-09-28 ENCOUNTER — Encounter: Payer: Self-pay | Admitting: Family Medicine

## 2021-10-03 ENCOUNTER — Ambulatory Visit (INDEPENDENT_AMBULATORY_CARE_PROVIDER_SITE_OTHER): Payer: 59 | Admitting: Family Medicine

## 2021-10-03 ENCOUNTER — Other Ambulatory Visit: Payer: Self-pay

## 2021-10-03 VITALS — BP 121/79 | HR 101 | Wt 195.0 lb

## 2021-10-03 DIAGNOSIS — Z23 Encounter for immunization: Secondary | ICD-10-CM | POA: Diagnosis not present

## 2021-10-03 DIAGNOSIS — Z3492 Encounter for supervision of normal pregnancy, unspecified, second trimester: Secondary | ICD-10-CM

## 2021-10-03 DIAGNOSIS — Q639 Congenital malformation of kidney, unspecified: Secondary | ICD-10-CM

## 2021-10-03 NOTE — Patient Instructions (Signed)

## 2021-10-03 NOTE — Progress Notes (Signed)
° ° °  Subjective:  Gloria Ochoa is a 21 y.o. G2P0010 at [redacted]w[redacted]d being seen today for ongoing prenatal care.  She is currently monitored for the following issues for this low-risk pregnancy and has Supervision of low-risk pregnancy and Renal anomaly on their problem list.  Patient reports no complaints.  Contractions: Not present. Vag. Bleeding: None.  Movement: Present. Denies leaking of fluid.   The following portions of the patient's history were reviewed and updated as appropriate: allergies, current medications, past family history, past medical history, past social history, past surgical history and problem list. Problem list updated.  Objective:   Vitals:   10/03/21 1007  BP: 121/79  Pulse: (!) 101  Weight: 195 lb (88.5 kg)    Fetal Status: Fetal Heart Rate (bpm): 142   Movement: Present     General:  Alert, oriented and cooperative. Patient is in no acute distress.  Skin: Skin is warm and dry. No rash noted.   Cardiovascular: Normal heart rate noted  Respiratory: Normal respiratory effort, no problems with respiration noted  Abdomen: Soft, gravid, appropriate for gestational age. Pain/Pressure: Absent     Pelvic: Vag. Bleeding: None     Cervical exam deferred        Extremities: Normal range of motion.  Edema: None  Mental Status: Normal mood and affect. Normal behavior. Normal judgment and thought content.   Urinalysis:      Assessment and Plan:  Pregnancy: G2P0010 at [redacted]w[redacted]d  1. Encounter for supervision of low-risk pregnancy in second trimester BP and FHR normal Accepts TDaP Normal third tri labs last visit  2. Renal anomaly Following w MFM  Preterm labor symptoms and general obstetric precautions including but not limited to vaginal bleeding, contractions, leaking of fluid and fetal movement were reviewed in detail with the patient. Please refer to After Visit Summary for other counseling recommendations.  Return in 2 weeks (on 10/17/2021) for Dyad patient, ob  visit.   Venora Maples, MD

## 2021-10-04 ENCOUNTER — Encounter: Payer: 59 | Admitting: Certified Nurse Midwife

## 2021-10-11 DIAGNOSIS — Z419 Encounter for procedure for purposes other than remedying health state, unspecified: Secondary | ICD-10-CM | POA: Diagnosis not present

## 2021-10-13 ENCOUNTER — Other Ambulatory Visit: Payer: Self-pay

## 2021-10-13 ENCOUNTER — Ambulatory Visit: Payer: 59 | Attending: Obstetrics and Gynecology

## 2021-10-13 ENCOUNTER — Other Ambulatory Visit: Payer: Self-pay | Admitting: *Deleted

## 2021-10-13 ENCOUNTER — Ambulatory Visit: Payer: 59

## 2021-10-13 DIAGNOSIS — O35EXX0 Maternal care for other (suspected) fetal abnormality and damage, fetal genitourinary anomalies, not applicable or unspecified: Secondary | ICD-10-CM | POA: Insufficient documentation

## 2021-10-13 DIAGNOSIS — Z3A31 31 weeks gestation of pregnancy: Secondary | ICD-10-CM

## 2021-10-13 DIAGNOSIS — O359XX1 Maternal care for (suspected) fetal abnormality and damage, unspecified, fetus 1: Secondary | ICD-10-CM

## 2021-10-14 DIAGNOSIS — Z3A32 32 weeks gestation of pregnancy: Secondary | ICD-10-CM | POA: Diagnosis not present

## 2021-10-14 DIAGNOSIS — O26893 Other specified pregnancy related conditions, third trimester: Secondary | ICD-10-CM | POA: Diagnosis not present

## 2021-10-14 DIAGNOSIS — Z3A31 31 weeks gestation of pregnancy: Secondary | ICD-10-CM | POA: Diagnosis not present

## 2021-10-14 DIAGNOSIS — R109 Unspecified abdominal pain: Secondary | ICD-10-CM | POA: Diagnosis not present

## 2021-10-14 DIAGNOSIS — O219 Vomiting of pregnancy, unspecified: Secondary | ICD-10-CM | POA: Diagnosis not present

## 2021-10-15 DIAGNOSIS — O26893 Other specified pregnancy related conditions, third trimester: Secondary | ICD-10-CM | POA: Diagnosis not present

## 2021-10-15 DIAGNOSIS — Z3A31 31 weeks gestation of pregnancy: Secondary | ICD-10-CM | POA: Diagnosis not present

## 2021-10-15 DIAGNOSIS — R109 Unspecified abdominal pain: Secondary | ICD-10-CM | POA: Diagnosis not present

## 2021-10-15 DIAGNOSIS — O219 Vomiting of pregnancy, unspecified: Secondary | ICD-10-CM | POA: Diagnosis not present

## 2021-10-16 DIAGNOSIS — R112 Nausea with vomiting, unspecified: Secondary | ICD-10-CM | POA: Diagnosis not present

## 2021-10-16 DIAGNOSIS — O212 Late vomiting of pregnancy: Secondary | ICD-10-CM | POA: Diagnosis not present

## 2021-10-16 DIAGNOSIS — O99113 Other diseases of the blood and blood-forming organs and certain disorders involving the immune mechanism complicating pregnancy, third trimester: Secondary | ICD-10-CM | POA: Diagnosis not present

## 2021-10-16 DIAGNOSIS — R109 Unspecified abdominal pain: Secondary | ICD-10-CM | POA: Diagnosis not present

## 2021-10-16 DIAGNOSIS — Z3A32 32 weeks gestation of pregnancy: Secondary | ICD-10-CM | POA: Diagnosis not present

## 2021-10-16 DIAGNOSIS — O26893 Other specified pregnancy related conditions, third trimester: Secondary | ICD-10-CM | POA: Diagnosis not present

## 2021-10-16 DIAGNOSIS — Z3689 Encounter for other specified antenatal screening: Secondary | ICD-10-CM | POA: Diagnosis not present

## 2021-10-16 DIAGNOSIS — O99283 Endocrine, nutritional and metabolic diseases complicating pregnancy, third trimester: Secondary | ICD-10-CM | POA: Diagnosis not present

## 2021-10-16 DIAGNOSIS — D72829 Elevated white blood cell count, unspecified: Secondary | ICD-10-CM | POA: Diagnosis not present

## 2021-10-16 DIAGNOSIS — O219 Vomiting of pregnancy, unspecified: Secondary | ICD-10-CM | POA: Diagnosis not present

## 2021-10-16 DIAGNOSIS — E860 Dehydration: Secondary | ICD-10-CM | POA: Diagnosis not present

## 2021-10-18 ENCOUNTER — Telehealth (INDEPENDENT_AMBULATORY_CARE_PROVIDER_SITE_OTHER): Payer: 59 | Admitting: Family Medicine

## 2021-10-18 ENCOUNTER — Other Ambulatory Visit: Payer: Self-pay

## 2021-10-18 DIAGNOSIS — Z3493 Encounter for supervision of normal pregnancy, unspecified, third trimester: Secondary | ICD-10-CM

## 2021-10-18 DIAGNOSIS — K639 Disease of intestine, unspecified: Secondary | ICD-10-CM | POA: Insufficient documentation

## 2021-10-18 DIAGNOSIS — Z3A31 31 weeks gestation of pregnancy: Secondary | ICD-10-CM

## 2021-10-18 NOTE — Progress Notes (Addendum)
° °  PRENATAL VISIT NOTE TELEHEALTH OBSTETRICS VISIT ENCOUNTER NOTE  Provider [facility]: [facility]   Patient location: Home  I connected with Gloria Ochoa on 10/18/2021 by video and telephone at home and verified that I am speaking with the correct person using two identifiers.    I discussed the limitations, risks, security and privacy concerns of performing an evaluation and management service by telephone and the availability of in person appointments. I also discussed with the patient that there may be a patient responsible charge related to this service. The patient expressed understanding and agreed to proceed.  Subjective:  Gloria Ochoa is a 21 y.o. G2P0010 at [redacted]w[redacted]d being seen today for ongoing prenatal care.  She is currently monitored for the following issues for this low-risk pregnancy and has Supervision of low-risk pregnancy and Renal anomaly on their problem list.  Patient reports no complaints.  Contractions: Not present. Vag. Bleeding: None.  Movement: Present. Denies leaking of fluid.   The following portions of the patient's history were reviewed and updated as appropriate: allergies, current medications, past family history, past medical history, past social history, past surgical history and problem list.   Objective:  There were no vitals filed for this visit.  Fetal Status:     Movement: Present     General:  Alert, oriented and cooperative. Patient is in no acute distress.  Skin: Skin is warm and dry. No rash noted.   Cardiovascular: Normal heart rate noted  Respiratory: Normal respiratory effort, no problems with respiration noted  Abdomen: Soft, gravid, appropriate for gestational age.  Pain/Pressure: Absent     Pelvic: Cervical exam deferred        Extremities: Normal range of motion.     Mental Status: Normal mood and affect. Normal behavior. Normal judgment and thought content.   Assessment and Plan:  Pregnancy:  G2P0010 at [redacted]w[redacted]d  1. Encounter for supervision of low-risk pregnancy in third trimester GI sx started Saturday 2/4 -- recommended that she monitor sx and come to MAU if persistent for 1 week or if she develops contractions  2. Renal  anomaly - MFM [place] on 3/3  Preterm labor symptoms and general obstetric precautions including but not limited to vaginal bleeding, contractions, leaking of fluid and fetal movement were reviewed in detail with the patient. Please refer to After Visit Summary for other counseling recommendations.   Return in about 2 weeks (around 11/01/2021) for Routine prenatal care, Mom+Baby Combined Care, scheduled visit.  Future Appointments  Date Time Provider Department Center  11/03/2021 10:55 AM [facility]  11/10/2021  9:45 AM [facility] NURSE [facility]  11/10/2021 10:00 AM [facility] US1 [facility]  11/20/2021 10:55 AM [facility]  11/27/2021  9:55 AM [facility]  12/05/2021  1:55 PM [facility]    Federico Flake, MD

## 2021-11-03 ENCOUNTER — Other Ambulatory Visit: Payer: Self-pay

## 2021-11-03 ENCOUNTER — Ambulatory Visit (INDEPENDENT_AMBULATORY_CARE_PROVIDER_SITE_OTHER): Payer: 59 | Admitting: Family Medicine

## 2021-11-03 VITALS — BP 115/76 | HR 90 | Wt 201.0 lb

## 2021-11-03 DIAGNOSIS — K639 Disease of intestine, unspecified: Secondary | ICD-10-CM

## 2021-11-03 DIAGNOSIS — Q639 Congenital malformation of kidney, unspecified: Secondary | ICD-10-CM

## 2021-11-03 DIAGNOSIS — Z3493 Encounter for supervision of normal pregnancy, unspecified, third trimester: Secondary | ICD-10-CM

## 2021-11-03 NOTE — Progress Notes (Signed)
° ° °  Subjective:  Gloria Ochoa is a 21 y.o. G2P0010 at [redacted]w[redacted]d being seen today for ongoing prenatal care.  She is currently monitored for the following issues for this low-risk pregnancy and has Supervision of low-risk pregnancy; Renal anomaly; and Dilated colon seen on MFM [place] on their problem list.  Patient reports no complaints.  Contractions: Not present. Vag. Bleeding: None.  Movement: Present. Denies leaking of fluid.   The following portions of the patient's history were reviewed and updated as appropriate: allergies, current medications, past family history, past medical history, past social history, past surgical history and problem list. Problem list updated.  Objective:   Vitals:   11/03/21 1059  BP: 115/76  Pulse: 90  Weight: 201 lb (91.2 kg)    Fetal Status: Fetal Heart Rate (bpm): 146   Movement: Present     General:  Alert, oriented and cooperative. Patient is in no acute distress.  Skin: Skin is warm and dry. No rash noted.   Cardiovascular: Normal heart rate noted  Respiratory: Normal respiratory effort, no problems with respiration noted  Abdomen: Soft, gravid, appropriate for gestational age. Pain/Pressure: Absent     Pelvic: Vag. Bleeding: None     Cervical exam deferred        Extremities: Normal range of motion.  Edema: None  Mental Status: Normal mood and affect. Normal behavior. Normal judgment and thought content.   Urinalysis:      Assessment and Plan:  Pregnancy: G2P0010 at [redacted]w[redacted]d  1. Encounter for supervision of low-risk pregnancy in third trimester BP and FHR normal Discussed epidural, no association with back pain Confirmed she would like nexplanon for contraception Discussed swabs at next visit  2. Colon abnormality MFM [place] next week to follow up  3. Renal anomaly MFM [place] next week to follow up  Preterm labor symptoms and general obstetric precautions including but not limited to vaginal bleeding, contractions, leaking of fluid and fetal  movement were reviewed in detail with the patient. Please refer to After Visit Summary for other counseling recommendations.  Return in 2 weeks (on 11/17/2021) for Dyad patient, ob visit.   Venora Maples, MD

## 2021-11-03 NOTE — Patient Instructions (Signed)

## 2021-11-08 DIAGNOSIS — Z419 Encounter for procedure for purposes other than remedying health state, unspecified: Secondary | ICD-10-CM | POA: Diagnosis not present

## 2021-11-10 ENCOUNTER — Encounter: Payer: Self-pay | Admitting: *Deleted

## 2021-11-10 ENCOUNTER — Ambulatory Visit: Payer: 59 | Admitting: *Deleted

## 2021-11-10 ENCOUNTER — Ambulatory Visit: Payer: 59

## 2021-11-10 ENCOUNTER — Ambulatory Visit: Payer: 59 | Attending: Maternal & Fetal Medicine

## 2021-11-10 ENCOUNTER — Other Ambulatory Visit: Payer: Self-pay

## 2021-11-10 VITALS — BP 121/68 | HR 74

## 2021-11-10 DIAGNOSIS — O99320 Drug use complicating pregnancy, unspecified trimester: Secondary | ICD-10-CM

## 2021-11-10 DIAGNOSIS — Z3A35 35 weeks gestation of pregnancy: Secondary | ICD-10-CM

## 2021-11-10 DIAGNOSIS — O359XX0 Maternal care for (suspected) fetal abnormality and damage, unspecified, not applicable or unspecified: Secondary | ICD-10-CM

## 2021-11-10 DIAGNOSIS — F1290 Cannabis use, unspecified, uncomplicated: Secondary | ICD-10-CM | POA: Insufficient documentation

## 2021-11-10 DIAGNOSIS — O359XX1 Maternal care for (suspected) fetal abnormality and damage, unspecified, fetus 1: Secondary | ICD-10-CM | POA: Insufficient documentation

## 2021-11-10 DIAGNOSIS — Z362 Encounter for other antenatal screening follow-up: Secondary | ICD-10-CM | POA: Diagnosis not present

## 2021-11-20 ENCOUNTER — Ambulatory Visit (INDEPENDENT_AMBULATORY_CARE_PROVIDER_SITE_OTHER): Payer: 59 | Admitting: Family Medicine

## 2021-11-20 ENCOUNTER — Encounter: Payer: Self-pay | Admitting: Family Medicine

## 2021-11-20 ENCOUNTER — Other Ambulatory Visit (HOSPITAL_COMMUNITY)
Admission: RE | Admit: 2021-11-20 | Discharge: 2021-11-20 | Disposition: A | Discharge status: Home / Self Care | Payer: 59 | Source: Ambulatory Visit | Attending: Family Medicine | Admitting: Family Medicine

## 2021-11-20 ENCOUNTER — Other Ambulatory Visit: Payer: Self-pay

## 2021-11-20 VITALS — BP 133/85 | HR 73 | Wt 202.0 lb

## 2021-11-20 DIAGNOSIS — Z3493 Encounter for supervision of normal pregnancy, unspecified, third trimester: Secondary | ICD-10-CM

## 2021-11-20 DIAGNOSIS — R030 Elevated blood-pressure reading, without diagnosis of hypertension: Secondary | ICD-10-CM

## 2021-11-20 DIAGNOSIS — K639 Disease of intestine, unspecified: Secondary | ICD-10-CM

## 2021-11-20 LAB — OB RESULTS CONSOLE GC/CHLAMYDIA: Gonorrhea: NEGATIVE

## 2021-11-20 NOTE — Progress Notes (Signed)
? ? ?  PRENATAL VISIT NOTE ? ?Subjective:  ?Gloria Ochoa is a 21 y.o. G2P0010 at [redacted]w[redacted]d being seen today for ongoing prenatal care.  She is currently monitored for the following issues for this low-risk pregnancy and has Supervision of low-risk pregnancy; Renal anomaly; and Dilated colon seen on MFM [place] on their problem list. ? ?Patient reports  HA, feels "bad", + nausea, abdominal pain that is epigastric .  Contractions: Not present. Vag. Bleeding: None.  Movement: Present. Denies leaking of fluid.  ? ?The following portions of the patient's history were reviewed and updated as appropriate: allergies, current medications, past family history, past medical history, past social history, past surgical history and problem list.  ? ?Objective:  ? ?Vitals:  ? 11/20/21 1121  ?BP: 133/85  ?Pulse: 73  ?Weight: 202 lb (91.6 kg)  ? ? ?Fetal Status: Fetal Heart Rate (bpm): 143   Movement: Present    ? ?General:  Alert, oriented and cooperative. Patient is in no acute distress. Does not look like she feels well.   ?Skin: Skin is warm and dry. No rash noted.   ?Cardiovascular: Normal heart rate noted  ?Respiratory: Normal respiratory effort, no problems with respiration noted  ?Abdomen: Soft, gravid, appropriate for gestational age.  Pain/Pressure: Absent     ?Pelvic: Cervical exam deferred        ?Extremities: Normal range of motion.  Edema: None  ?Mental Status: Normal mood and affect. Normal behavior. Normal judgment and thought content.  ? ?Assessment and Plan:  ?Pregnancy: G2P0010 at [redacted]w[redacted]d ?1. Encounter for supervision of low-risk pregnancy in third trimester ?Not feeling well today ?Collected normal 36 wk labs  ?- GC/Chlamydia probe amp ([place])not at [facility] ?- Culture, beta strep (group b only) ? ?2. Colon abnormality ? ?3. Elevated BP without diagnosis of hypertension ?Concerning for developing gHTN given rise in baseline BP and sx. ?Instructed to try tylenol to help HA and if not improved to go to MAU ?Will follow up  labs ?- CBC ?- Comprehensive metabolic panel ?- Protein / creatinine ratio, urine ? ? ? ? ?Preterm labor symptoms and general obstetric precautions including but not limited to vaginal bleeding, contractions, leaking of fluid and fetal movement were reviewed in detail with the patient. ?Please refer to After Visit Summary for other counseling recommendations.  ? ?Return in about 1 day (around 11/21/2021) for BP check. ? ?Future Appointments  ?Date Time Provider Department Center  ?11/22/2021  1:15 PM [facility]  ?11/27/2021  9:55 AM [facility]  ?12/05/2021  1:55 PM [facility]  ? ? ?Federico Flake, MD ?

## 2021-11-21 ENCOUNTER — Encounter: Payer: Self-pay | Admitting: Family Medicine

## 2021-11-21 ENCOUNTER — Telehealth: Payer: Self-pay

## 2021-11-21 LAB — GC/CHLAMYDIA PROBE AMP (~~LOC~~) NOT AT ARMC
Chlamydia: NEGATIVE
Comment: NEGATIVE
Comment: NORMAL
Neisseria Gonorrhea: NEGATIVE

## 2021-11-21 LAB — COMPREHENSIVE METABOLIC PANEL
ALT: 8 IU/L (ref 0–32)
AST: 10 IU/L (ref 0–40)
Albumin/Globulin Ratio: 1.3 (ref 1.2–2.2)
Albumin: 3.5 g/dL — ABNORMAL LOW (ref 3.9–5.0)
Alkaline Phosphatase: 126 IU/L — ABNORMAL HIGH (ref 42–106)
BUN/Creatinine Ratio: 8 — ABNORMAL LOW (ref 9–23)
BUN: 5 mg/dL — ABNORMAL LOW (ref 6–20)
Bilirubin Total: 0.2 mg/dL (ref 0.0–1.2)
CO2: 18 mmol/L — ABNORMAL LOW (ref 20–29)
Calcium: 9.1 mg/dL (ref 8.7–10.2)
Chloride: 106 mmol/L (ref 96–106)
Creatinine, Ser: 0.63 mg/dL (ref 0.57–1.00)
Globulin, Total: 2.7 g/dL (ref 1.5–4.5)
Glucose: 78 mg/dL (ref 70–99)
Potassium: 4.3 mmol/L (ref 3.5–5.2)
Sodium: 142 mmol/L (ref 134–144)
Total Protein: 6.2 g/dL (ref 6.0–8.5)
eGFR: 131 mL/min/{1.73_m2} (ref >59)

## 2021-11-21 LAB — CBC
Hematocrit: 32.3 % — ABNORMAL LOW (ref 34.0–46.6)
Hemoglobin: 10.5 g/dL — ABNORMAL LOW (ref 11.1–15.9)
MCH: 27.7 pg (ref 26.6–33.0)
MCHC: 32.5 g/dL (ref 31.5–35.7)
MCV: 85 fL (ref 79–97)
Platelets: 266 10*3/uL (ref 150–450)
RBC: 3.79 x10E6/uL (ref 3.77–5.28)
RDW: 13.6 % (ref 11.7–15.4)
WBC: 15.3 10*3/uL — ABNORMAL HIGH (ref 3.4–10.8)

## 2021-11-21 LAB — PROTEIN / CREATININE RATIO, URINE
Creatinine, Urine: 210.2 mg/dL
Protein, Ur: 34.8 mg/dL
Protein/Creat Ratio: 166 mg/g creat (ref 0–200)

## 2021-11-21 NOTE — Telephone Encounter (Addendum)
-----   Message from Federico Flake, MD sent at 11/21/2021  4:05 PM EDT ----- ?Blood work and urine for PEC/GHTN negative. 36 week labs WNL.  ? ?Please call patient and see if she is improved ? ?Called pt and unable to leave message due to message stating "try call again later".   MyChart message sent.   ? ?Blair Lundeen,RN  ?

## 2021-11-22 ENCOUNTER — Ambulatory Visit (INDEPENDENT_AMBULATORY_CARE_PROVIDER_SITE_OTHER): Payer: 59 | Admitting: Family Medicine

## 2021-11-22 ENCOUNTER — Other Ambulatory Visit: Payer: Self-pay

## 2021-11-22 VITALS — BP 121/79 | HR 92 | Wt 202.0 lb

## 2021-11-22 DIAGNOSIS — K639 Disease of intestine, unspecified: Secondary | ICD-10-CM

## 2021-11-22 DIAGNOSIS — Z3493 Encounter for supervision of normal pregnancy, unspecified, third trimester: Secondary | ICD-10-CM

## 2021-11-22 NOTE — Progress Notes (Signed)
Gv n ? ?Subjective:  ?Gloria Ochoa is a 21 y.o. G2P0010 at [redacted]w[redacted]d being seen today for ongoing prenatal care.  She is currently monitored for the following issues for this low-risk pregnancy and has Supervision of low-risk pregnancy; Renal anomaly; and Dilated colon seen on MFM [place] on their problem list. ? ?Patient reports no complaints.  Contractions: Not present.  .  Movement: Present. Denies leaking of fluid.  ? ?The following portions of the patient's history were reviewed and updated as appropriate: allergies, current medications, past family history, past medical history, past social history, past surgical history and problem list. Problem list updated. ? ?Objective:  ? ?Vitals:  ? 11/22/21 1339  ?BP: 121/79  ?Pulse: 92  ?Weight: 202 lb (91.6 kg)  ? ? ?Fetal Status: Fetal Heart Rate (bpm): 147   Movement: Present    ? ?General:  Alert, oriented and cooperative. Patient is in no acute distress.  ?Skin: Skin is warm and dry. No rash noted.   ?Cardiovascular: Normal heart rate noted  ?Respiratory: Normal respiratory effort, no problems with respiration noted  ?Abdomen: Soft, gravid, appropriate for gestational age. Pain/Pressure: Absent     ?Pelvic:       ?Cervical exam deferred        ?Extremities: Normal range of motion.     ?Mental Status: Normal mood and affect. Normal behavior. Normal judgment and thought content.  ? ?Urinalysis:     ? ?Assessment and Plan:  ?Pregnancy: G2P0010 at [redacted]w[redacted]d ? ?1. Encounter for supervision of low-risk pregnancy in third trimester ?BP and FHR normal ?BP check today for creeping BP noted at last visit, PreE labs were normal ?Today report me she feels much better, cont routine prenatal care ? ?2. Dilated colon seen on MFM [place] ?Resolved! ? ?Preterm labor symptoms and general obstetric precautions including but not limited to vaginal bleeding, contractions, leaking of fluid and fetal movement were reviewed in detail with the patient. ?Please refer to After Visit Summary for other  counseling recommendations.  ?No follow-ups on file. ? ? ?Clarnce Flock, MD ? ?

## 2021-11-24 LAB — CULTURE, BETA STREP (GROUP B ONLY): Strep Gp B Culture: NEGATIVE

## 2021-11-27 ENCOUNTER — Other Ambulatory Visit: Payer: Self-pay

## 2021-11-27 ENCOUNTER — Ambulatory Visit (INDEPENDENT_AMBULATORY_CARE_PROVIDER_SITE_OTHER): Payer: 59 | Admitting: Family Medicine

## 2021-11-27 VITALS — BP 110/75 | HR 84 | Wt 204.8 lb

## 2021-11-27 DIAGNOSIS — Z3493 Encounter for supervision of normal pregnancy, unspecified, third trimester: Secondary | ICD-10-CM

## 2021-11-27 NOTE — Progress Notes (Signed)
? ? ?  PRENATAL VISIT NOTE ? ?Subjective:  ?Gloria Ochoa is a 21 y.o. G2P0010 at [redacted]w[redacted]d being seen today for ongoing prenatal care.  She is currently monitored for the following issues for this low-risk pregnancy and has Supervision of low-risk pregnancy and Renal anomaly on their problem list. ? ?Patient reports no complaints.  Contractions: Irritability. Vag. Bleeding: None.  Movement: Present. Denies leaking of fluid.  ? ?The following portions of the patient's history were reviewed and updated as appropriate: allergies, current medications, past family history, past medical history, past social history, past surgical history and problem list.  ? ?Objective:  ? ?Vitals:  ? 11/27/21 1022  ?BP: 110/75  ?Pulse: 84  ?Weight: 204 lb 12.8 oz (92.9 kg)  ? ? ?Fetal Status: Fetal Heart Rate (bpm): 165   Movement: Present    ? ?General:  Alert, oriented and cooperative. Patient is in no acute distress.  ?Skin: Skin is warm and dry. No rash noted.   ?Cardiovascular: Normal heart rate noted  ?Respiratory: Normal respiratory effort, no problems with respiration noted  ?Abdomen: Soft, gravid, appropriate for gestational age.  Pain/Pressure: Present     ?Pelvic: Cervical exam deferred        ?Extremities: Normal range of motion.  Edema: None  ?Mental Status: Normal mood and affect. Normal behavior. Normal judgment and thought content.  ? ?Assessment and Plan:  ?Pregnancy: G2P0010 at [redacted]w[redacted]d ?1. Encounter for supervision of low-risk pregnancy in third trimester ?Partner her today ?FH WNL ?Patient appears much better than last week ? ? ?Preterm labor symptoms and general obstetric precautions including but not limited to vaginal bleeding, contractions, leaking of fluid and fetal movement were reviewed in detail with the patient. ?Please refer to After Visit Summary for other counseling recommendations.  ? ?Return in about 1 week (around 12/04/2021) for Mom+Baby Combined Care. ? ?Future Appointments  ?Date Time Provider Department  Center  ?12/05/2021  1:55 PM [facility]  ?12/11/2021 10:55 AM [facility]  ? ? ?Federico Flake, MD ?

## 2021-12-05 ENCOUNTER — Ambulatory Visit (INDEPENDENT_AMBULATORY_CARE_PROVIDER_SITE_OTHER): Payer: 59 | Admitting: Family Medicine

## 2021-12-05 ENCOUNTER — Other Ambulatory Visit: Payer: Self-pay

## 2021-12-05 VITALS — BP 127/73 | HR 84 | Wt 204.3 lb

## 2021-12-05 DIAGNOSIS — Z3493 Encounter for supervision of normal pregnancy, unspecified, third trimester: Secondary | ICD-10-CM

## 2021-12-05 NOTE — Progress Notes (Signed)
? ?  Subjective:  ?Gloria Ochoa is a 21 y.o. G2P0010 at [redacted]w[redacted]d being seen today for ongoing prenatal care.  She is currently monitored for the following issues for this low-risk pregnancy and has Supervision of low-risk pregnancy on their problem list. ? ?Patient reports no complaints.  Contractions: Not present. Vag. Bleeding: None.  Movement: Present. Denies leaking of fluid.  ? ?The following portions of the patient's history were reviewed and updated as appropriate: allergies, current medications, past family history, past medical history, past social history, past surgical history and problem list. Problem list updated. ? ?Objective:  ? ?Vitals:  ? 12/05/21 1416  ?BP: 127/73  ?Pulse: 84  ?Weight: 204 lb 4.8 oz (92.7 kg)  ? ? ?Fetal Status: Fetal Heart Rate (bpm): 140   Movement: Present    ? ?General:  Alert, oriented and cooperative. Patient is in no acute distress.  ?Skin: Skin is warm and dry. No rash noted.   ?Cardiovascular: Normal heart rate noted  ?Respiratory: Normal respiratory effort, no problems with respiration noted  ?Abdomen: Soft, gravid, appropriate for gestational age. Pain/Pressure: Absent     ?Pelvic: Vag. Bleeding: None     ?Cervical exam deferred        ?Extremities: Normal range of motion.  Edema: None  ?Mental Status: Normal mood and affect. Normal behavior. Normal judgment and thought content.  ? ?Urinalysis:     ? ?Assessment and Plan:  ?Pregnancy: G2P0010 at [redacted]w[redacted]d ? ?1. Encounter for supervision of low-risk pregnancy in third trimester ?BP and FHR normal ?Discuss post dates testing and IOL next visit ?Confirmed vertex by bedside US ? ?Term labor symptoms and general obstetric precautions including but not limited to vaginal bleeding, contractions, leaking of fluid and fetal movement were reviewed in detail with the patient. ?Please refer to After Visit Summary for other counseling recommendations.  ?Return in 1 week (on 12/12/2021) for Dyad patient, ob visit. ? ? ?Clarnce Flock,  MD ? ?

## 2021-12-05 NOTE — Patient Instructions (Signed)

## 2021-12-09 DIAGNOSIS — Z419 Encounter for procedure for purposes other than remedying health state, unspecified: Secondary | ICD-10-CM | POA: Diagnosis not present

## 2021-12-11 ENCOUNTER — Telehealth (INDEPENDENT_AMBULATORY_CARE_PROVIDER_SITE_OTHER): Payer: 59 | Admitting: Family Medicine

## 2021-12-11 DIAGNOSIS — Z3A39 39 weeks gestation of pregnancy: Secondary | ICD-10-CM

## 2021-12-11 DIAGNOSIS — Z3493 Encounter for supervision of normal pregnancy, unspecified, third trimester: Secondary | ICD-10-CM

## 2021-12-11 NOTE — Progress Notes (Signed)
Pt states does not have BP Cuff at home. ?

## 2021-12-11 NOTE — Progress Notes (Addendum)
   Telehealth PRENATAL VISIT NOTE  I connected with Gloria Ochoa on 12/11/21 at 10:55 AM EDT By MyChart Video at home and verified that I am speaking with the correct person using two identifiers. I was located at [facility]   I discussed the limitations, risks, security and privacy concerns of performing an evaluation and management service by telehealth - MyChart and the availability of in person appointments. I also discussed with the patient that there may be a patient responsible charge related to this service. The patient expressed understanding and agreed to proceed.  Subjective:  Gloria Ochoa is a 21 y.o. G2P0010 at [redacted]w[redacted]d being seen today for ongoing prenatal care.  She is currently monitored for the following issues for this low-risk pregnancy and has Supervision of low-risk pregnancy on their problem list.  Patient reports  pains in stomach since waking up, every 7-8 minute, reports they have stayed the same intensity. Woke up at 7 am and they have been consistently happening.  Feels like bad period cramping. Reports she is eating and drinking normally. Currently with a support person at home.  .  Contractions: Not present. Vag. Bleeding: None.  Movement: Present. Denies leaking of fluid.   The following portions of the patient's history were reviewed and updated as appropriate: allergies, current medications, past family history, past medical history, past social history, past surgical history and problem list.   Objective:  There were no vitals filed for this visit.  Fetal Status:     Movement: Present     General:  Alert, oriented and cooperative. Patient is in no acute distress.  Skin: Skin is warm and dry. No rash noted.   Cardiovascular: Normal heart rate noted  Respiratory: Normal respiratory effort, no problems with respiration noted  Abdomen: Soft, gravid, appropriate for gestational age.  Pain/Pressure: Present     Pelvic: Cervical exam deferred         Extremities: Normal range of motion.  Edema: None  Mental Status: Normal mood and affect. Normal behavior. Normal judgment and thought content.   Assessment and Plan:  Pregnancy: G2P0010 at [redacted]w[redacted]d 1. Encounter for supervision of low-risk pregnancy in third trimester Likely in early labor Reviewed moving and walking to help increase contractions Discussed when to go to hospital Patient voiced understanding Set up appt for next week though unlikely to need this appt   Term labor symptoms and general obstetric precautions including but not limited to vaginal bleeding, contractions, leaking of fluid and fetal movement were reviewed in detail with the patient. Please refer to After Visit Summary for other counseling recommendations.   Return in about 1 week (around 12/18/2021) for Routine prenatal care, Mom+Baby Combined Care.  Future Appointments  Date Time Provider Department Center  12/18/2021  1:15 PM Federico Flake, MD [facility]    Federico Flake, MD

## 2021-12-12 ENCOUNTER — Inpatient Hospital Stay (HOSPITAL_COMMUNITY): Payer: 59 | Admitting: Anesthesiology

## 2021-12-12 ENCOUNTER — Encounter (HOSPITAL_COMMUNITY): Payer: Self-pay | Admitting: Obstetrics and Gynecology

## 2021-12-12 ENCOUNTER — Other Ambulatory Visit: Payer: Self-pay

## 2021-12-12 ENCOUNTER — Inpatient Hospital Stay (HOSPITAL_COMMUNITY)
Admission: AD | Admit: 2021-12-12 | Discharge: 2021-12-14 | DRG: 807 | Disposition: A | Discharge status: Home / Self Care | Payer: 59 | Attending: Obstetrics & Gynecology | Admitting: Obstetrics & Gynecology

## 2021-12-12 DIAGNOSIS — Z3A00 Weeks of gestation of pregnancy not specified: Secondary | ICD-10-CM | POA: Diagnosis not present

## 2021-12-12 DIAGNOSIS — Z3493 Encounter for supervision of normal pregnancy, unspecified, third trimester: Principal | ICD-10-CM

## 2021-12-12 DIAGNOSIS — O139 Gestational [pregnancy-induced] hypertension without significant proteinuria, unspecified trimester: Secondary | ICD-10-CM | POA: Diagnosis not present

## 2021-12-12 DIAGNOSIS — O26893 Other specified pregnancy related conditions, third trimester: Secondary | ICD-10-CM | POA: Diagnosis not present

## 2021-12-12 DIAGNOSIS — O134 Gestational [pregnancy-induced] hypertension without significant proteinuria, complicating childbirth: Secondary | ICD-10-CM | POA: Diagnosis present

## 2021-12-12 DIAGNOSIS — Z3A39 39 weeks gestation of pregnancy: Secondary | ICD-10-CM

## 2021-12-12 DIAGNOSIS — Z3490 Encounter for supervision of normal pregnancy, unspecified, unspecified trimester: Secondary | ICD-10-CM

## 2021-12-12 LAB — COMPREHENSIVE METABOLIC PANEL
ALT: 19 U/L (ref 0–44)
AST: 19 U/L (ref 15–41)
Albumin: 2.9 g/dL — ABNORMAL LOW (ref 3.5–5.0)
Alkaline Phosphatase: 108 U/L (ref 38–126)
Anion gap: 8 (ref 5–15)
BUN: 7 mg/dL (ref 6–20)
CO2: 23 mmol/L (ref 22–32)
Calcium: 9 mg/dL (ref 8.9–10.3)
Chloride: 108 mmol/L (ref 98–111)
Creatinine, Ser: 0.72 mg/dL (ref 0.44–1.00)
GFR, Estimated: >60 mL/min (ref >60)
Glucose, Bld: 91 mg/dL (ref 70–99)
Potassium: 3.9 mmol/L (ref 3.5–5.1)
Sodium: 139 mmol/L (ref 135–145)
Total Bilirubin: 0.5 mg/dL (ref 0.3–1.2)
Total Protein: 6.7 g/dL (ref 6.5–8.1)

## 2021-12-12 LAB — CBC
HCT: 34.2 % — ABNORMAL LOW (ref 36.0–46.0)
Hemoglobin: 10.9 g/dL — ABNORMAL LOW (ref 12.0–15.0)
MCH: 27.2 pg (ref 26.0–34.0)
MCHC: 31.9 g/dL (ref 30.0–36.0)
MCV: 85.3 fL (ref 80.0–100.0)
Platelets: 268 10*3/uL (ref 150–400)
RBC: 4.01 MIL/uL (ref 3.87–5.11)
RDW: 14.9 % (ref 11.5–15.5)
WBC: 17 10*3/uL — ABNORMAL HIGH (ref 4.0–10.5)
nRBC: 0 % (ref 0.0–0.2)

## 2021-12-12 LAB — PROTEIN / CREATININE RATIO, URINE
Creatinine, Urine: 268.65 mg/dL
Protein Creatinine Ratio: 0.21 mg/mg{Cre} — ABNORMAL HIGH (ref 0.00–0.15)
Total Protein, Urine: 57 mg/dL

## 2021-12-12 LAB — RPR: RPR Ser Ql: NONREACTIVE

## 2021-12-12 LAB — TYPE AND SCREEN
ABO/RH(D): A POS
Antibody Screen: NEGATIVE

## 2021-12-12 MED ORDER — LACTATED RINGERS IV BOLUS
1000.0000 mL | Freq: Once | INTRAVENOUS | Status: AC
  Administered 2021-12-12: 1000 mL via INTRAVENOUS

## 2021-12-12 MED ORDER — FENTANYL CITRATE (PF) 100 MCG/2ML IJ SOLN
100.0000 ug | INTRAMUSCULAR | Status: DC | PRN
  Administered 2021-12-12: 100 ug via INTRAVENOUS
  Filled 2021-12-12: qty 2

## 2021-12-12 MED ORDER — EPHEDRINE 5 MG/ML INJ: 10.0000 mg | INTRAVENOUS | Status: DC | PRN

## 2021-12-12 MED ORDER — ONDANSETRON HCL 4 MG/2ML IJ SOLN
4.0000 mg | Freq: Four times a day (QID) | INTRAMUSCULAR | Status: DC | PRN
  Administered 2021-12-13: 4 mg via INTRAVENOUS
  Filled 2021-12-12: qty 2

## 2021-12-12 MED ORDER — PHENYLEPHRINE 40 MCG/ML (10ML) SYRINGE FOR IV PUSH (FOR BLOOD PRESSURE SUPPORT): 80.0000 ug | PREFILLED_SYRINGE | INTRAVENOUS | Status: DC | PRN

## 2021-12-12 MED ORDER — LIDOCAINE HCL (PF) 1 % IJ SOLN
INTRAMUSCULAR | Status: DC | PRN
  Administered 2021-12-12 (×2): 4 mL via EPIDURAL

## 2021-12-12 MED ORDER — FLEET ENEMA 7-19 GM/118ML RE ENEM: 1.0000 | ENEMA | RECTAL | Status: DC | PRN

## 2021-12-12 MED ORDER — LACTATED RINGERS IV SOLN
500.0000 mL | Freq: Once | INTRAVENOUS | Status: AC
  Administered 2021-12-12: 500 mL via INTRAVENOUS

## 2021-12-12 MED ORDER — OXYCODONE-ACETAMINOPHEN 5-325 MG PO TABS: 2.0000 | ORAL_TABLET | ORAL | Status: DC | PRN

## 2021-12-12 MED ORDER — ACETAMINOPHEN 325 MG PO TABS
650.0000 mg | ORAL_TABLET | ORAL | Status: DC | PRN
Start: 2021-12-12 — End: 2021-12-13

## 2021-12-12 MED ORDER — LACTATED RINGERS IV SOLN: 500.0000 mL | INTRAVENOUS | Status: DC | PRN

## 2021-12-12 MED ORDER — OXYTOCIN-SODIUM CHLORIDE 30-0.9 UT/500ML-% IV SOLN
2.5000 [IU]/h | INTRAVENOUS | Status: DC
  Administered 2021-12-13: 2.5 [IU]/h via INTRAVENOUS
  Filled 2021-12-12 (×2): qty 500

## 2021-12-12 MED ORDER — OXYTOCIN BOLUS FROM INFUSION
333.0000 mL | Freq: Once | INTRAVENOUS | Status: AC
  Administered 2021-12-13: 333 mL via INTRAVENOUS

## 2021-12-12 MED ORDER — OXYTOCIN-SODIUM CHLORIDE 30-0.9 UT/500ML-% IV SOLN
1.0000 m[IU]/min | INTRAVENOUS | Status: DC
  Administered 2021-12-12: 2 m[IU]/min via INTRAVENOUS

## 2021-12-12 MED ORDER — LACTATED RINGERS IV SOLN
INTRAVENOUS | Status: DC
Start: 2021-12-12 — End: 2021-12-13

## 2021-12-12 MED ORDER — LIDOCAINE HCL (PF) 1 % IJ SOLN: 30.0000 mL | INTRAMUSCULAR | Status: DC | PRN

## 2021-12-12 MED ORDER — DIPHENHYDRAMINE HCL 50 MG/ML IJ SOLN
12.5000 mg | INTRAMUSCULAR | Status: DC | PRN
  Administered 2021-12-12: 12.5 mg via INTRAVENOUS
  Filled 2021-12-12: qty 1

## 2021-12-12 MED ORDER — FENTANYL-BUPIVACAINE-NACL 0.5-0.125-0.9 MG/250ML-% EP SOLN
12.0000 mL/h | EPIDURAL | Status: DC | PRN
  Administered 2021-12-12: 12 mL/h via EPIDURAL
  Filled 2021-12-12: qty 250

## 2021-12-12 MED ORDER — SOD CITRATE-CITRIC ACID 500-334 MG/5ML PO SOLN: 30.0000 mL | ORAL | Status: DC | PRN

## 2021-12-12 MED ORDER — OXYCODONE-ACETAMINOPHEN 5-325 MG PO TABS: 1.0000 | ORAL_TABLET | ORAL | Status: DC | PRN

## 2021-12-12 MED ORDER — TERBUTALINE SULFATE 1 MG/ML IJ SOLN: 0.2500 mg | Freq: Once | INTRAMUSCULAR | Status: DC | PRN

## 2021-12-12 MED ORDER — ONDANSETRON HCL 4 MG/2ML IJ SOLN
4.0000 mg | Freq: Once | INTRAMUSCULAR | Status: AC
  Administered 2021-12-12: 4 mg via INTRAVENOUS
  Filled 2021-12-12: qty 2

## 2021-12-12 NOTE — Anesthesia Preprocedure Evaluation (Signed)
Anesthesia Evaluation  Patient identified by MRN, date of birth, ID band Patient awake    Reviewed: Allergy & Precautions, Patient's Chart, lab work & pertinent test results  History of Anesthesia Complications Negative for: history of anesthetic complications  Airway Mallampati: II  TM Distance: >3 FB Neck ROM: Full    Dental no notable dental hx.    Pulmonary neg pulmonary ROS,    Pulmonary exam normal        Cardiovascular negative cardio ROS Normal cardiovascular exam     Neuro/Psych negative neurological ROS  negative psych ROS   GI/Hepatic negative GI ROS, Neg liver ROS,   Endo/Other  negative endocrine ROS  Renal/GU negative Renal ROS  negative genitourinary   Musculoskeletal negative musculoskeletal ROS (+)   Abdominal   Peds  Hematology negative hematology ROS (+)   Anesthesia Other Findings Day of surgery medications reviewed with patient.  Reproductive/Obstetrics (+) Pregnancy                             Anesthesia Physical Anesthesia Plan  ASA: 2  Anesthesia Plan: Epidural   Post-op Pain Management:    Induction:   PONV Risk Score and Plan: Treatment may vary due to age or medical condition  Airway Management Planned: Natural Airway  Additional Equipment: Fetal Monitoring  Intra-op Plan:   Post-operative Plan:   Informed Consent: I have reviewed the patients History and Physical, chart, labs and discussed the procedure including the risks, benefits and alternatives for the proposed anesthesia with the patient or authorized representative who has indicated his/her understanding and acceptance.       Plan Discussed with:   Anesthesia Plan Comments:         Anesthesia Quick Evaluation  

## 2021-12-12 NOTE — Progress Notes (Signed)
Patient ID: Gloria Ochoa, female   DOB: 2000/12/10, 21 y.o.   MRN: [id number] ?Gloria Ochoa is a 21 y.o. G2P0010 at [redacted]w[redacted]d admitted for induction of labor due to gestational hypertension  and NRNST, early labor ? ?Subjective: ?comfortable with epidural and denies headache, visual changes, ruq/epigastric pain, n/v ? ?Objective: ?BP 138/89   Pulse 72   Temp 97.9 ?F (36.6 ?C) (Oral)   Resp 16   Ht 5\' 9"  (1.753 m)   Wt 93.7 kg   LMP 03/10/2021 (Approximate)   SpO2 100%   BMI 30.51 kg/m?  ?No intake/output data recorded. ? ?FHR baseline 135 bpm, Variability: moderate, Accelerations:present, Decelerations:  Absent ?Toco: regular, every 1-2.5 minutes  ? ?SVE:   Dilation: 4.5 ?Effacement (%): 80 ?Station: -1, 0 ?Exam by:: [email or web address], RN ? ?Pitocin @ 18 mu/min ? ?Labs: ?Lab Results  ?Component Value Date  ? WBC 17.0 (H) 12/12/2021  ? HGB 10.9 (L) 12/12/2021  ? HCT 34.2 (L) 12/12/2021  ? MCV 85.3 12/12/2021  ? PLT 268 12/12/2021  ? ? ?Assessment / Plan: ?IOL d/t GHTN, NRNST, early labor. Doing well on pitocin. RN to repeat SVE @ 2230, if not changing will AROM  ? ?Labor: early ?Fetal Wellbeing:  Category I ?Pain Control:  epidural ?Pre-eclampsia: asymptomatic and bp's stable ?I/D:  GBS neg ?Anticipated MOD: NSVB ? ?2231 CNM, WHNP-BC ?12/12/2021, 9:49 PM  ?

## 2021-12-12 NOTE — Anesthesia Procedure Notes (Signed)
Epidural ?Patient location during procedure: OB ?Start time: 12/12/2021 8:32 AM ?End time: 12/12/2021 8:35 AM ? ?Staffing ?Anesthesiologist: Kaylyn Layer, MD ?Performed: anesthesiologist  ? ?Preanesthetic Checklist ?Completed: patient identified, IV checked, risks and benefits discussed, monitors and equipment checked, pre-op evaluation and timeout performed ? ?Epidural ?Patient position: sitting ?Prep: DuraPrep and site prepped and draped ?Patient monitoring: continuous pulse ox, blood pressure and heart rate ?Approach: midline ?Location: L3-L4 ?Injection technique: LOR air ? ?Needle:  ?Needle type: Tuohy  ?Needle gauge: 17 G ?Needle length: 9 cm ?Needle insertion depth: 6 cm ?Catheter type: closed end flexible ?Catheter size: 19 Gauge ?Catheter at skin depth: 11 cm ?Test dose: negative and Other (1% lidocaine) ? ?Assessment ?Events: blood not aspirated, injection not painful, no injection resistance, no paresthesia and negative IV test ? ?Additional Notes ?Patient identified. Risks, benefits, and alternatives discussed with patient including but not limited to bleeding, infection, nerve damage, paralysis, failed block, incomplete pain control, headache, blood pressure changes, nausea, vomiting, reactions to medication, itching, and postpartum back pain. Confirmed with bedside nurse the patient's most recent platelet count. Confirmed with patient that they are not currently taking any anticoagulation, have any bleeding history, or any family history of bleeding disorders. Patient expressed understanding and wished to proceed. All questions were answered. Sterile technique was used throughout the entire procedure. Please see nursing notes for vital signs.  ? ?Crisp LOR after 2 needle redirections (mild lumbar dextroscoliosis suspected). Test dose was given through epidural catheter and negative prior to continuing to dose epidural or start infusion. Warning signs of high block given to the patient including  shortness of breath, tingling/numbness in hands, complete motor block, or any concerning symptoms with instructions to call for help. Patient was given instructions on fall risk and not to get out of bed. All questions and concerns addressed with instructions to call with any issues or inadequate analgesia.  Reason for block:procedure for pain ? ? ? ?

## 2021-12-12 NOTE — H&P (Signed)
OBSTETRIC ADMISSION HISTORY AND PHYSICAL ? ?EDIN SKARDA is a 21 y.o. female G2P0010 with IUP at [redacted]w[redacted]d presenting for spontaneous onset of labor starting at 0100 with strong contractions q63min or less. She reports +FMs. No LOF, VB, blurry vision, headaches, peripheral edema, or RUQ pain. She plans on formula feeding. She requests Nexplanon for birth control. ? ?Dating: By [facility] U/S --->  Estimated Date of Delivery: 12/15/21 ? ?Sono:  @[redacted]w[redacted]d , normal anatomy, cephalic presentation, 2473g, , EFW 5lb 7oz ? ?Prenatal History/Complications: ?None, Mom+Baby pt, prenatal records fully reviewed. Baby had mild bowel dilation and renal pyelectasis but both resolved. ? ?Past Medical History: ?History reviewed. No pertinent past medical history. ? ?Past Surgical History: ?Past Surgical History:  ?Procedure Laterality Date  ? NO PAST SURGERIES    ? ?Obstetrical History: ?OB History   ? ? Gravida  ?2  ? Para  ?   ? Term  ?   ? Preterm  ?   ? AB  ?1  ? Living  ?0  ?  ? ? SAB  ?   ? IAB  ?1  ? Ectopic  ?   ? Multiple  ?   ? Live Births  ?   ?   ?  ?  ? ?Social History: ?Social History  ? ?Socioeconomic History  ? Marital status: Single  ?  Spouse name: Not on file  ? Number of children: Not on file  ? Years of education: Not on file  ? Highest education level: Not on file  ?Occupational History  ? Not on file  ?Tobacco Use  ? Smoking status: Never  ? Smokeless tobacco: Never  ?Vaping Use  ? Vaping Use: Former  ? Substances: Flavoring  ? Devices: quit a couple weeks ago  ?Substance and Sexual Activity  ? Alcohol use: Not Currently  ?  Comment: occ  ? Drug use: Not Currently  ?  Types: Marijuana  ? Sexual activity: Yes  ?  Birth control/protection: None  ?Other Topics Concern  ? Not on file  ?Social History Narrative  ? Not on file  ? ?Social Determinants of Health  ? ?Financial Resource Strain: Not on file  ?Food Insecurity: No Food Insecurity  ? Worried About 77%LTJ in the Last Year: Never true  ? Ran Out of  Food in the Last Year: Never true  ?Transportation Needs: No Transportation Needs  ? Lack of Transportation (Medical): No  ? Lack of Transportation (Non-Medical): No  ?Physical Activity: Not on file  ?Stress: Not on file  ?Social Connections: Not on file  ? ?Family History: ?Family History  ?Problem Relation Age of Onset  ? Depression Mother   ? ?Allergies: ?No Known Allergies ? ?Medications Prior to Admission  ?Medication Sig Dispense Refill Last Dose  ? Prenatal 27-1 MG TABS Take 1 tablet by mouth daily. 30 tablet 10 Past Week  ? ?Review of Systems: ?All systems reviewed and negative except as stated in HPI ? ?PE: ?Blood pressure 134/80, pulse 85, temperature 98.2 ?F (36.8 ?C), temperature source Oral, resp. rate 20, height 5\' 9"  (1.753 m), weight 206 lb 9.6 oz (93.7 kg), last menstrual period 03/10/2021, SpO2 100 %. ?General appearance: alert, cooperative, appears stated age, and mild distress ?Lungs: regular rate and effort ?Heart: regular rate  ?Abdomen: soft, non-tender ?Extremities: Homans sign is negative, no sign of DVT ?Presentation: cephalic ?EFM: 140 bpm, minimal variability, 10x10 accels, no decels ?Toco: not tracing well, q3-40min visually and palpate strong ?SVE:  ?  Dilation: 2 ?Effacement (%): 60 ?Station: -2 ?Exam by:: Fabiola Backer, RN ? ?Prenatal labs: ?ABO, Rh: A/Positive/-- (10/07 1029) ?Antibody: Negative (01/16 0935) ?Rubella: 7.37 (10/07 1029) ?RPR: Non Reactive (01/16 0934)  ?HBsAg: Negative (10/07 1029)  ?HIV: Non Reactive (01/16 0934)  ?GBS: Negative/-- (03/13 1307)  ?2 hr GTT 87/115/77 (normal) ? ?Prenatal Transfer Tool  ?Maternal Diabetes: No ?Genetic Screening: Normal ?Maternal Ultrasounds/Referrals: Fetal renal pyelectasis and Other:dilated fetal bowel (both fully resolved) ?Fetal Ultrasounds or other Referrals:  Referred to Materal Fetal Medicine  ?Maternal Substance Abuse:  No ?Significant Maternal Medications:  None ?Significant Maternal Lab Results: Group B Strep negative ? ?No  results found for this or any previous visit (from the past 24 hour(s)). ? ?Patient Active Problem List  ? Diagnosis Date Noted  ? Supervision of low-risk pregnancy 06/01/2021  ? ? ?Assessment: ?ASHTYNN BERKE is a 21 y.o. G2P0010 at [redacted]w[redacted]d here for spontaneous onset of latent labor with non-reassuring NST (minimal reactivity). ? ?1. Labor: Latent, but regularly contracting and uncomfortable ?2. FWB: Cat 2, LR bolus running ?3. Pain: wants IV pain meds and then planning epidural ?4. GBS: negative ?5. BP normal ?  ?Plan: ?Admit to L&D for expectant management and continuous fetal monitoring ?- report called to L&D team ?- orders placed ? ?Bernerd Limbo, CNM  ?12/12/2021, 6:02 AM ? ? ? ? ?

## 2021-12-12 NOTE — Progress Notes (Signed)
Gloria Ochoa is a 21 y.o. G2P0010 at [redacted]w[redacted]d admitted for early labor/NRNST ? ?Subjective: ?Gloria Ochoa is doing well. She is comfortable with epidural. Family is present at bedside ? ?Objective: ?BP 111/64   Pulse 72   Temp 98 ?F (36.7 ?C) (Axillary)   Resp 18   Ht 5\' 9"  (1.753 m)   Wt 93.7 kg   LMP 03/10/2021 (Approximate)   SpO2 100%   BMI 30.51 kg/m?  ?No intake/output data recorded. ?No intake/output data recorded. ? ?FHT: 135 bpm, moderate variability, +15x15 accels, no decels ?UC: Q 2-5 mins ?SVE:   Dilation: 3.5 ?Effacement (%): 80 ?Station: -1 ?Exam by:: [email or web address], CNM ? ?Labs: ?Lab Results  ?Component Value Date  ? WBC 17.0 (H) 12/12/2021  ? HGB 10.9 (L) 12/12/2021  ? HCT 34.2 (L) 12/12/2021  ? MCV 85.3 12/12/2021  ? PLT 268 12/12/2021  ? ? ?Assessment / Plan: ?Gloria Ochoa is a 21 y.o. G2P0010 at [redacted]w[redacted]d admitted for early labor/NRNST ? ?Labor: Patient currently on Pitocin 29mu/min. Continue titration per unit policy. AROM when appropriate. ?gHTN: labs normal, BP's normotensive to mild range, asymptomatic. Continue to monitor.  ?Fetal Wellbeing:  Cat 1 ?Pain Control:  Epidural ?I/D:   GBS negative ?Anticipated MOD:  NSVD ? ? ?9m, CNM ?12/12/2021, 4:28 PM ? ? ?

## 2021-12-12 NOTE — Progress Notes (Signed)
Gloria Ochoa is a 21 y.o. G2P0010 at [redacted]w[redacted]d admitted for early labor/NRNST ? ?Subjective: ?Gloria Ochoa is doing well. She is comfortable with epidural.  ? ?Objective: ?BP (!) 142/80   Pulse 71   Temp 98 ?F (36.7 ?C) (Axillary)   Resp 18   Ht 5\' 9"  (1.753 m)   Wt 93.7 kg   LMP 03/10/2021 (Approximate)   SpO2 100%   BMI 30.51 kg/m?  ?No intake/output data recorded. ?No intake/output data recorded. ? ?FHT: 140 bpm, moderate variability, +10x10 accels, no decels ?UC: Q 3-6 mins ?SVE:   Dilation: 3 ?Effacement (%): 60 ?Station: -2 ?Exam by:: A. [email or web address], RN ? ?Labs: ?Lab Results  ?Component Value Date  ? WBC 17.0 (H) 12/12/2021  ? HGB 10.9 (L) 12/12/2021  ? HCT 34.2 (L) 12/12/2021  ? MCV 85.3 12/12/2021  ? PLT 268 12/12/2021  ? ? ?Assessment / Plan: ?Gloria Ochoa is a 21 y.o. G2P0010 at [redacted]w[redacted]d admitted for early labor/NRNST ? ?Labor: Progressing without augmentation. Will recheck patient in 2 hours. ?Elevated BP: Multiple mild range BP's, no diagnosis of HTN. CMP and UPCR added on. Patient asymptomatic. Will continue to monitor ?Fetal Wellbeing:  Cat 1 ?Pain Control:  Epidural ?I/D:   GBS negative ?Anticipated MOD:  NSVD ? ? ?[redacted]w[redacted]d, CNM ?12/12/2021, 9:40 AM ? ? ?

## 2021-12-12 NOTE — MAU Note (Signed)
PT SAYS UC STRONG SINCE 0100 ?[facility] WITH CLINIC ?VE - NONE ?DENIES HSV ?GBS- NEG ?

## 2021-12-12 NOTE — Progress Notes (Signed)
Patient ID: Gloria Ochoa, female   DOB: 04/16/2001, 21 y.o.   MRN: [id number] [name] is a 21 y.o. G2P0010 at [redacted]w[redacted]d admitted for induction of labor due to [facility], NRNST, early labor ? ?Subjective: ?comfortable with epidural and denies headache, visual changes, ruq/epigastric pain, n/v ? ?Objective: ?BP 135/82   Pulse 63   Temp 97.9 ?F (36.6 ?C) (Oral)   Resp 16   Ht 5\' 9"  (1.753 m)   Wt 93.7 kg   LMP 03/10/2021 (Approximate)   SpO2 100%   BMI 30.51 kg/m?  ?No intake/output data recorded. ? ?FHR baseline 145 bpm, Variability: moderate, Accelerations:not present, Decelerations:  Absent ?Toco: q 1-2.26mins  ? ?SVE:   4.5/80/-2, AROM small amt clear fluid ?Pitocin @ 18 mu/min ? ?Labs: ?Lab Results  ?Component Value Date  ? WBC 17.0 (H) 12/12/2021  ? HGB 10.9 (L) 12/12/2021  ? HCT 34.2 (L) 12/12/2021  ? MCV 85.3 12/12/2021  ? PLT 268 12/12/2021  ? ? ?Assessment / Plan: ?IOL d/t CHTN, NRNST, early labor- doing well on pitocin, now AROM'd ? ?Labor: early ?Fetal Wellbeing:  Category I ?Pain Control:  epidural ?Pre-eclampsia: asymptomatic and bp's stable ?I/D:  GBS neg ?Anticipated MOD: NSVB ? ?02/11/2022 CNM, WHNP-BC ?12/12/2021, 10:56 PM  ?

## 2021-12-13 ENCOUNTER — Encounter (HOSPITAL_COMMUNITY): Payer: Self-pay | Admitting: Obstetrics and Gynecology

## 2021-12-13 DIAGNOSIS — O139 Gestational [pregnancy-induced] hypertension without significant proteinuria, unspecified trimester: Secondary | ICD-10-CM | POA: Diagnosis not present

## 2021-12-13 DIAGNOSIS — Z3A39 39 weeks gestation of pregnancy: Secondary | ICD-10-CM

## 2021-12-13 DIAGNOSIS — O134 Gestational [pregnancy-induced] hypertension without significant proteinuria, complicating childbirth: Secondary | ICD-10-CM

## 2021-12-13 LAB — CBC WITH DIFFERENTIAL/PLATELET
Abs Immature Granulocytes: 0.11 10*3/uL — ABNORMAL HIGH (ref 0.00–0.07)
Basophils Absolute: 0.1 10*3/uL (ref 0.0–0.1)
Basophils Relative: 0 %
Eosinophils Absolute: 0 10*3/uL (ref 0.0–0.5)
Eosinophils Relative: 0 %
HCT: 32.4 % — ABNORMAL LOW (ref 36.0–46.0)
Hemoglobin: 10.2 g/dL — ABNORMAL LOW (ref 12.0–15.0)
Immature Granulocytes: 1 %
Lymphocytes Relative: 10 %
Lymphs Abs: 2.1 10*3/uL (ref 0.7–4.0)
MCH: 26.9 pg (ref 26.0–34.0)
MCHC: 31.5 g/dL (ref 30.0–36.0)
MCV: 85.5 fL (ref 80.0–100.0)
Monocytes Absolute: 1.2 10*3/uL — ABNORMAL HIGH (ref 0.1–1.0)
Monocytes Relative: 6 %
Neutro Abs: 17.7 10*3/uL — ABNORMAL HIGH (ref 1.7–7.7)
Neutrophils Relative %: 83 %
Platelets: 232 10*3/uL (ref 150–400)
RBC: 3.79 MIL/uL — ABNORMAL LOW (ref 3.87–5.11)
RDW: 14.9 % (ref 11.5–15.5)
WBC: 21.2 10*3/uL — ABNORMAL HIGH (ref 4.0–10.5)
nRBC: 0 % (ref 0.0–0.2)

## 2021-12-13 MED ORDER — ONDANSETRON HCL 4 MG PO TABS: 4.0000 mg | ORAL_TABLET | ORAL | Status: DC | PRN

## 2021-12-13 MED ORDER — ACETAMINOPHEN 325 MG PO TABS
650.0000 mg | ORAL_TABLET | ORAL | Status: DC | PRN
  Administered 2021-12-13 – 2021-12-14 (×4): 650 mg via ORAL
  Filled 2021-12-13 (×4): qty 2

## 2021-12-13 MED ORDER — TRANEXAMIC ACID-NACL 1000-0.7 MG/100ML-% IV SOLN
1000.0000 mg | INTRAVENOUS | Status: AC
  Administered 2021-12-13: 1000 mg via INTRAVENOUS

## 2021-12-13 MED ORDER — COCONUT OIL OIL: 1.0000 "application " | TOPICAL_OIL | Status: DC | PRN

## 2021-12-13 MED ORDER — DIBUCAINE (PERIANAL) 1 % EX OINT: 1.0000 "application " | TOPICAL_OINTMENT | CUTANEOUS | Status: DC | PRN

## 2021-12-13 MED ORDER — FUROSEMIDE 20 MG PO TABS
20.0000 mg | ORAL_TABLET | Freq: Every day | ORAL | Status: DC
  Administered 2021-12-13 – 2021-12-14 (×2): 20 mg via ORAL
  Filled 2021-12-13 (×2): qty 1

## 2021-12-13 MED ORDER — ONDANSETRON HCL 4 MG/2ML IJ SOLN: 4.0000 mg | INTRAMUSCULAR | Status: DC | PRN

## 2021-12-13 MED ORDER — SENNOSIDES-DOCUSATE SODIUM 8.6-50 MG PO TABS
2.0000 | ORAL_TABLET | Freq: Every day | ORAL | Status: DC
  Administered 2021-12-14: 2 via ORAL
  Filled 2021-12-13: qty 2

## 2021-12-13 MED ORDER — DIPHENHYDRAMINE HCL 25 MG PO CAPS: 25.0000 mg | ORAL_CAPSULE | Freq: Four times a day (QID) | ORAL | Status: DC | PRN

## 2021-12-13 MED ORDER — MISOPROSTOL 200 MCG PO TABS
800.0000 ug | ORAL_TABLET | Freq: Once | ORAL | Status: AC
  Administered 2021-12-13: 800 ug via RECTAL

## 2021-12-13 MED ORDER — TRANEXAMIC ACID-NACL 1000-0.7 MG/100ML-% IV SOLN
INTRAVENOUS | Status: AC
  Filled 2021-12-13: qty 100

## 2021-12-13 MED ORDER — NIFEDIPINE ER OSMOTIC RELEASE 30 MG PO TB24
30.0000 mg | ORAL_TABLET | Freq: Every day | ORAL | Status: DC
  Administered 2021-12-13 – 2021-12-14 (×2): 30 mg via ORAL
  Filled 2021-12-13 (×3): qty 1

## 2021-12-13 MED ORDER — BENZOCAINE-MENTHOL 20-0.5 % EX AERO
1.0000 "application " | INHALATION_SPRAY | CUTANEOUS | Status: DC | PRN
  Filled 2021-12-13: qty 56

## 2021-12-13 MED ORDER — PRENATAL MULTIVITAMIN CH: 1.0000 | ORAL_TABLET | Freq: Every day | ORAL | Status: DC

## 2021-12-13 MED ORDER — TRANEXAMIC ACID-NACL 1000-0.7 MG/100ML-% IV SOLN: 1000.0000 mg | Freq: Once | INTRAVENOUS | Status: DC | PRN

## 2021-12-13 MED ORDER — MISOPROSTOL 200 MCG PO TABS
ORAL_TABLET | ORAL | Status: AC
  Filled 2021-12-13: qty 4

## 2021-12-13 MED ORDER — SIMETHICONE 80 MG PO CHEW
80.0000 mg | CHEWABLE_TABLET | ORAL | Status: DC | PRN
  Filled 2021-12-13: qty 1

## 2021-12-13 MED ORDER — IBUPROFEN 600 MG PO TABS
600.0000 mg | ORAL_TABLET | Freq: Four times a day (QID) | ORAL | Status: DC
  Administered 2021-12-13 – 2021-12-14 (×5): 600 mg via ORAL
  Filled 2021-12-13 (×5): qty 1

## 2021-12-13 MED ORDER — WITCH HAZEL-GLYCERIN EX PADS: 1.0000 "application " | MEDICATED_PAD | CUTANEOUS | Status: DC | PRN

## 2021-12-13 NOTE — Anesthesia Postprocedure Evaluation (Signed)
Anesthesia Post Note ? ?Patient: Gloria Ochoa ? ?Procedure(s) Performed: AN AD HOC LABOR EPIDURAL ? ?  ? ?Patient location during evaluation: Mother Baby ?Anesthesia Type: Epidural ?Level of consciousness: awake and alert ?Pain management: pain level controlled ?Vital Signs Assessment: post-procedure vital signs reviewed and stable ?Respiratory status: spontaneous breathing, nonlabored ventilation and respiratory function stable ?Cardiovascular status: stable ?Postop Assessment: no headache, no backache and epidural receding ?Anesthetic complications: no ? ? ?No notable events documented. ? ?Last Vitals:  ?Vitals:  ? 12/13/21 0503 12/13/21 0600  ?BP: (!) 151/92 (!) 146/94  ?Pulse: 74 78  ?Resp: 20 18  ?Temp: 37.7 ?C 37.4 ?C  ?SpO2: 100% 100%  ?  ?Last Pain:  ?Vitals:  ? 12/13/21 0810  ?TempSrc:   ?PainSc: 5   ? ?Pain Goal:   ? ?  ?  ?  ?  ?  ?  ?  ? ?Alexxander Kurt ? ? ? ? ?

## 2021-12-13 NOTE — Discharge Summary (Signed)
? ?  Postpartum Discharge Summary ? ?Date of Service updated ? ?   ?Patient Name: Gloria Ochoa ?DOB: 10/12/2000 ?MRN: [id number] ? ?Date of admission: 12/12/2021 ?Delivery date:12/13/2021  ?Delivering provider: Roma Schanz  ?Date of discharge: 12/14/2021 ? ?Admitting diagnosis: Indication for care in labor or delivery [O75.9] ?Intrauterine pregnancy: [redacted]w[redacted]d    ?Secondary diagnosis:  Principal Problem: ?  Vaginal delivery ?Active Problems: ?  Supervision of low-risk pregnancy ?  Gestational hypertension ? ?Additional problems: GHTN, NRNST, early labor    ?Discharge diagnosis: Term Pregnancy Delivered                                              ?Post partum procedures: None ?Augmentation: AROM and Pitocin ?Complications: None ? ?Hospital course: Induction of Labor With Vaginal Delivery   ?21y.o. yo G2P0010 at 371w5das admitted to the hospital 12/12/2021 for induction of labor.  Indication for induction:  GHTN/NRNST .  Patient had an uncomplicated labor course as follows: ?Membrane Rupture Time/Date: 10:52 PM ,12/12/2021   ?Delivery Method:Vaginal, Spontaneous  ?Episiotomy: None  ?Lacerations:  Periurethral  ?Details of delivery can be found in separate delivery note.  Her postpartum BP was managed with procardia/lasix. She has a BP cuff at home and will monitor. Patient had a routine postpartum course. Patient is discharged home 12/14/21. ? ?Newborn Data: ?Birth date:12/13/2021  ?Birth time:3:05 AM  ?Gender:Female  ?Living status:Living  ?Apgars:8 ,9  ?Weight:2970 g  ? ?Magnesium Sulfate received: No ?BMZ received: No ?Rhophylac:N/A ?MMR:N/A ?T-DaP:Given prenatally ?Flu: No ?Transfusion:No ? ?Physical exam  ?Vitals:  ? 12/13/21 1642 12/13/21 2019 12/14/21 0514 12/14/21 0910  ?BP: 133/90 124/83 109/78 123/76  ?Pulse: 68 93 63 66  ?Resp: _0 ?Temp: 98.2 ?F (36.8 ?C) 98.5 ?F (36.9 ?C) 97.6 ?F (36.4 ?C)   ?TempSrc: Oral Oral Oral   ?SpO2:  100% 100%   ?Weight:      ?Height:      ? ?General: alert, cooperative,  and no distress ?Lochia: appropriate ?Uterine Fundus: firm ?Incision: N/A ?DVT Evaluation: minimal edema bilaterally to mid shin  ?Labs: ?Lab Results  ?Component Value Date  ? WBC 21.2 (H) 12/13/2021  ? HGB 10.2 (L) 12/13/2021  ? HCT 32.4 (L) 12/13/2021  ? MCV 85.5 12/13/2021  ? PLT 232 12/13/2021  ? ? ?  Latest Ref Rng & Units 12/12/2021  ?  6:07 AM  ?CMP  ?Glucose 70 - 99 mg/dL 91    ?BUN 6 - 20 mg/dL 7    ?Creatinine 0.44 - 1.00 mg/dL 0.72    ?Sodium 135 - 145 mmol/L 139    ?Potassium 3.5 - 5.1 mmol/L 3.9    ?Chloride 98 - 111 mmol/L 108    ?CO2 22 - 32 mmol/L 23    ?Calcium 8.9 - 10.3 mg/dL 9.0    ?Total Protein 6.5 - 8.1 g/dL 6.7    ?Total Bilirubin 0.3 - 1.2 mg/dL 0.5    ?Alkaline Phos 38 - 126 U/L 108    ?AST 15 - 41 U/L 19    ?ALT 0 - 44 U/L 19    ? ?Edinburgh Score: ? ?  12/13/2021  ?  5:18 AM  ?EdFlavia Shipperostnatal Depression Scale Screening Tool  ?I have been able to laugh and see the funny side of things. 0  ?I have looked forward with enjoyment  to things. 0  ?I have blamed myself unnecessarily when things went wrong. 0  ?I have been anxious or worried for no good reason. 0  ?I have felt scared or panicky for no good reason. 0  ?Things have been getting on top of me. 0  ?I have been so unhappy that I have had difficulty sleeping. 0  ?I have felt sad or miserable. 0  ?I have been so unhappy that I have been crying. 0  ?The thought of harming myself has occurred to me. 0  ?Edinburgh Postnatal Depression Scale Total 0  ? ? ? ?After visit meds:  ?Allergies as of 12/14/2021   ?No Known Allergies ?  ? ?  ?Medication List  ?  ? ?TAKE these medications   ? ?Acetaminophen Extra Strength 500 MG tablet ?Generic drug: acetaminophen ?Take 2 tablets (1,000 mg total) by mouth every 8 (eight) hours as needed (pain). ?  ?furosemide 20 MG tablet ?Commonly known as: LASIX ?Take 1 tablet (20 mg total) by mouth daily for 3 days. ?  ?ibuprofen 600 MG tablet ?Commonly known as: ADVIL ?Take 1 tablet (600 mg total) by mouth every 6  (six) hours as needed (pain). ?  ?NIFEdipine 30 MG 24 hr tablet ?Commonly known as: ADALAT CC ?Take 1 tablet (30 mg total) by mouth daily. ?  ?Prenatal 27-1 MG Tabs ?Take 1 tablet by mouth daily. ?  ? ?  ? ? ? ?Discharge home in stable condition ?Infant Feeding: Bottle ?Infant Disposition:home with mother ?Discharge instruction: per After Visit Summary and Postpartum booklet. ?Activity: Advance as tolerated. Pelvic rest for 6 weeks.  ?Diet: routine diet ?Future Appointments: ?Future Appointments  ?Date Time Provider [place]  ?12/20/2021  1:30 PM [facility] NURSE [facility]  ?01/10/2022  8:55 AM Dione Plover, Annice Needy, MD [facility]  ? ?Follow up Visit: ?Roma Schanz, CNM  P [facility] Admin Pool ?Please schedule this patient for PP visit in: bp check 1wk, then pp visit 4-6wks  ?High risk pregnancy complicated by: GHTN  ?Delivery mode:  SVD  ?Anticipated Birth Control:  Nexplanon at postpartum visit  ?PP Procedures needed: BP check  ?Schedule Integrated BH visit: no  ?Provider: Any provider  ? ?12/14/2021 ?Patriciaann Clan, DO ? ? ? ?

## 2021-12-13 NOTE — Progress Notes (Signed)
Patient ID: Gloria Ochoa, female   DOB: 24-Feb-2001, 21 y.o.   MRN: [id number] ?Gloria Ochoa is a 21 y.o. G2P0010 at [redacted]w[redacted]d admitted for induction of labor due to gestational hypertension  and NRNST, early labor ? ?Subjective: ?comfortable with epidural, denies headache, visual changes, ruq/epigastric pain, n/v, and no complaints ? ?Objective: ?BP (!) 107/55   Pulse 61   Temp 97.9 ?F (36.6 ?C) (Oral)   Resp 16   Ht 5\' 9"  (1.753 m)   Wt 93.7 kg   LMP 03/10/2021 (Approximate)   SpO2 100%   BMI 30.51 kg/m?  ?Total I/O ?In: -  ?Out: 550 [Urine:550] ? ?FHR baseline 145 bpm, Variability: moderate, Accelerations:not present, Decelerations:  Present  variable durine SVE ?Toco: q 1-76min  ? ?SVE:   Dilation: 10 ?Effacement (%): 100 ?Station: Plus 3 ?Exam by:: Knute Neu ? ?Pitocin @ 18 mu/min ? ?Labs: ?Lab Results  ?Component Value Date  ? WBC 17.0 (H) 12/12/2021  ? HGB 10.9 (L) 12/12/2021  ? HCT 34.2 (L) 12/12/2021  ? MCV 85.3 12/12/2021  ? PLT 268 12/12/2021  ? ? ?Assessment / Plan: ?IOL d/t GHTN, NRNST, early labor. Complete, will start pushing ? ?Labor: active ?Fetal Wellbeing:  Category II ?Pain Control:  epidural ?Pre-eclampsia: asymptomatic and bp's stable ?I/D:  GBS neg ?Anticipated MOD: NSVB ? ?[place], WHNP-BC ?12/13/2021, 2:21 AM  ?

## 2021-12-14 ENCOUNTER — Other Ambulatory Visit (HOSPITAL_COMMUNITY): Payer: Self-pay

## 2021-12-14 LAB — SURGICAL PATHOLOGY

## 2021-12-14 MED ORDER — ACETAMINOPHEN 500 MG PO TABS
1000.0000 mg | ORAL_TABLET | Freq: Three times a day (TID) | ORAL | 0 refills | Status: DC | PRN
  Filled 2021-12-14: qty 60, 10d supply, fill #0

## 2021-12-14 MED ORDER — NIFEDIPINE ER 30 MG PO TB24
30.0000 mg | ORAL_TABLET | Freq: Every day | ORAL | 1 refills | Status: DC
  Filled 2021-12-14: qty 30, 30d supply, fill #0

## 2021-12-14 MED ORDER — LIDOCAINE HCL 1 % IJ SOLN
0.0000 mL | Freq: Once | INTRAMUSCULAR | Status: DC | PRN
  Filled 2021-12-14: qty 20

## 2021-12-14 MED ORDER — FUROSEMIDE 20 MG PO TABS
20.0000 mg | ORAL_TABLET | Freq: Every day | ORAL | 0 refills | Status: DC
Start: 2021-12-14 — End: 2022-01-10
  Filled 2021-12-14: qty 3, 3d supply, fill #0

## 2021-12-14 MED ORDER — IBUPROFEN 600 MG PO TABS
600.0000 mg | ORAL_TABLET | Freq: Four times a day (QID) | ORAL | 0 refills | Status: DC | PRN
Start: 2021-12-14 — End: 2022-01-10
  Filled 2021-12-14: qty 40, 10d supply, fill #0

## 2021-12-14 MED ORDER — ETONOGESTREL 68 MG ~~LOC~~ IMPL
68.0000 mg | DRUG_IMPLANT | Freq: Once | SUBCUTANEOUS | Status: DC
  Filled 2021-12-14: qty 1

## 2021-12-14 NOTE — Social Work (Signed)
CSW acknowledged consult and completed a clinical assessment.  There are no barriers to d/c. ? ?Clinical assessment notes will be entered at a later time. ? ?Vivi Barrack, MSW, LCSW ?[facility]  ?Clinical Social Worker  ?[phone] ?12/14/2021  11:18 AM  ?

## 2021-12-14 NOTE — Clinical Social Work Maternal (Addendum)
?CLINICAL SOCIAL WORK MATERNAL/CHILD NOTE ? ?Patient Details  ?Name: Gloria Ochoa ?MRN: [id number] ?Date of Birth: 03/10/2001 ? ?Date:  11/12/21 ? ?Clinical Social Worker Initiating Note:  Kathrin Greathouse, LCSW Date/Time: Initiated:  12/14/21/1030    ? ?Child's Name:  Gloria Ochoa  ? ?Biological Parents:  Mother, Father (MOB: Gloria Ochoa 11/25/2001 and FOB: Gloria Ochoa 01-01-2001)  ? ?Need for Interpreter:  None  ? ?Reason for Referral:  Current Substance Use/Substance Use During Pregnancy    ? ?Address:  [address] ?[place] [address]  ?  ?Phone number:  [phone] (home)    ? ?Additional phone number:  ? ?Household Members/Support Persons (HM/SP):   Household Member/Support Person 1, Household Member/Support Person 2, Household Member/Support Person 3 ? ? ?HM/SP Name Relationship DOB or Age  ?HM/SP -1 Gloria Ochoa Significant Other 01-01-2001  ?HM/SP -2 Gloria Ochoa FOB's mom    ?HM/SP -3 Gloria Ochoa "   " FOB's brother    ?HM/SP -4        ?HM/SP -5        ?HM/SP -6        ?HM/SP -7        ?HM/SP -8        ? ? ?Natural Supports (not living in the home):  Parent  ? ?Professional Supports: None  ? ?Employment: Unemployed  ? ?Type of Work:    ? ?Education:  High school graduate  ? ?Homebound arranged:   ? ?Financial Resources:  Multimedia programmer   ? ?Other Resources:  WIC  ? ?Cultural/Religious Considerations Which May Impact Care:   ? ?Strengths:  Ability to meet basic needs  , Home prepared for child  , Pediatrician chosen  ? ?Psychotropic Medications:        ? ?Pediatrician:    [place] area ? ?Pediatrician List:  ? ?[place] Other  ?[place]    ?[facility]    ?[facility]    ?[facility]    ?[facility]    ? ? ?Pediatrician Fax Number:   ? ?Risk Factors/Current Problems:  Substance Use    ? ?Cognitive State:  Able to Concentrate  , Insightful  , Alert  , Linear Thinking    ? ?Mood/Affect:  Calm  , Comfortable  , Interested    ? ?CSW Assessment: CSW received consult for  Drug exposed newborn. CSW met with MOB to offer support and complete assessment.   ? ?CSW met with MOB at bedside and introduced CSW role. CSW observed MOB holding the infant and FOB present at bedside. CSW offered MOB privacy. FOB left the room to allow MOB privacy. MOB presented calm and welcomed CSW visit. MOB confirmed that the demographic information on hospital file is correct. MOB reported that she, FOB, FOB's mom, and brother live in the home together. MOB unable to provide their DOB and did not know FOB's brother last name. MOB identified FOB, her mom and FOB's mom as supports. MOB reported that she and FOB are currently unemployed and seeking work. MOB stated that she applied for [facility] early on in the pregnancy and received "a paper" in the mail but never received benefits. CSW offered to assist with follow up. CSW reached out to [facility], MOB informed that [facility] would call her with an appointment. MOB reported she has items for the infant including a pack n play where the infant will sleep.  ? ?CSW inquired about MOB substance use during the pregnancy. MOB reported that she smoked marijuana at  the beginning of the pregnancy, and she last used in November 2022. MOB reported that she had trouble eating, she felt sick, vomited so she used marijuana to help her eat and feel better. MOB denied using any other substance. CSW informed MOB about the hospital drug screen policy. MOB made aware that CSW will follow the infant's UDS/CDS and make a report, if warranted. MOB reported understanding and had no questions. CSW offered MOB substance resources. MOB declined resources.  ? ?CSW inquired how MOB has felt since giving birth. MOB reported feeling alright. CSW inquired if MOB has history of mental health MOB reported no history of mental health. CSW discussed PPD. CSW provided education regarding the baby blues period vs. perinatal mood disorders, discussed treatment and gave resources for mental health follow up if  concerns arise.  CSW recommended MOB complete a self-evaluation during the postpartum time period using the New Mom Checklist from Postpartum Progress and encouraged MOB to contact a medical professional if symptoms are noted at any time. CSW assessed MOB for safety. MOB denied thoughts of harm to self and others.  ? ?CSW provided review of Sudden Infant Death Syndrome (SIDS) precautions.  MOB reported understanding and plans to share precautions with FOB. CSW provided MOB with a list of community resources for first time parents. CSW assessed MOB for additional needs. MOB reported no further need.  ? ?CSW identifies no further need for intervention and no barriers to discharge at this time. ? ?CSW monitor infant's CDS/UDS and make a report to CPS, if warranted. ? ?CSW Plan/Description:  Sudden Infant Death Syndrome (SIDS) Education, CSW Will Continue to Monitor Umbilical Cord Tissue Drug Screen Results and Make Report if [facility], [place], No Further Intervention Required/No Barriers to Discharge, Perinatal Mood and Anxiety Disorder (PMADs) Education  ? ? ?Lia Hopping, LCSW ?12/14/2021, 12:26 PM ? ?

## 2021-12-18 ENCOUNTER — Encounter: Payer: 59 | Admitting: Family Medicine

## 2021-12-20 ENCOUNTER — Ambulatory Visit: Payer: 59

## 2021-12-25 ENCOUNTER — Telehealth (HOSPITAL_COMMUNITY): Payer: Self-pay | Admitting: *Deleted

## 2021-12-25 NOTE — Telephone Encounter (Signed)
Voicemail not setup. Unable to leave message. ? ?Duffy Rhody, RN 12-25-2021 at 12:30pm ?

## 2022-01-08 DIAGNOSIS — Z419 Encounter for procedure for purposes other than remedying health state, unspecified: Secondary | ICD-10-CM | POA: Diagnosis not present

## 2022-01-10 ENCOUNTER — Encounter: Payer: Self-pay | Admitting: Family Medicine

## 2022-01-10 ENCOUNTER — Ambulatory Visit (INDEPENDENT_AMBULATORY_CARE_PROVIDER_SITE_OTHER): Payer: 59 | Admitting: Family Medicine

## 2022-01-10 DIAGNOSIS — O133 Gestational [pregnancy-induced] hypertension without significant proteinuria, third trimester: Secondary | ICD-10-CM | POA: Diagnosis not present

## 2022-01-10 DIAGNOSIS — Z30017 Encounter for initial prescription of implantable subdermal contraceptive: Secondary | ICD-10-CM

## 2022-01-10 MED ORDER — ETONOGESTREL 68 MG ~~LOC~~ IMPL
68.0000 mg | DRUG_IMPLANT | Freq: Once | SUBCUTANEOUS | Status: AC
  Administered 2022-01-10: 68 mg via SUBCUTANEOUS

## 2022-01-10 NOTE — Progress Notes (Signed)
? ? ? ?  GYNECOLOGY OFFICE PROCEDURE NOTE ? ?Gloria Ochoa is a 21 y.o. G2P1011 here for Nexplanon insertion.  Last pap smear was: n/a ? ?Nexplanon insertion Procedure ?Patient identified, informed consent performed, consent signed.   Patient does understand that irregular bleeding is a very common side effect of this medication. She was advised to have backup contraception for one week after placement. Pregnancy test in clinic today was negative.  Appropriate time out taken.  Patient's left arm was prepped and draped in the usual sterile fashion. The ruler used to measure and mark insertion area.  Patient was prepped with alcohol swab and then injected with 3 ml of 1% lidocaine.  She was prepped with betadine, Nexplanon removed from packaging,  Device confirmed in needle, then inserted full length of needle and withdrawn per handbook instructions. Nexplanon was able to palpated in the patient's arm; patient palpated the insert herself. There was minimal blood loss.  Patient insertion site covered with guaze and a pressure bandage to reduce any bruising.  The patient tolerated the procedure well and was given post procedure instructions. ? ?Venora Maples, MD/MPH ?Family Medicine, Faculty Practice ?Center for [employer], [facility] ? ?

## 2022-01-10 NOTE — Progress Notes (Signed)
? ? ?Post Partum Visit Note ? ?Gloria Ochoa is a 21 y.o. G20P1011 female who presents for a postpartum visit. She is 4 weeks postpartum following a normal spontaneous vaginal delivery.  I have fully reviewed the prenatal and intrapartum course. The delivery was at 39.5 gestational weeks.  Anesthesia: epidural. Postpartum course has been uneventful. Baby is doing well. Baby is feeding by bottle - Kendamil  . Bleeding no bleeding. Bowel function is normal. Bladder function is normal. Patient is not sexually active. Contraception method is Nexplanon. Postpartum depression screening: negative. ? ? ?The pregnancy intention screening data noted above was reviewed. Potential methods of contraception were discussed. The patient elected to proceed with No data recorded. ? ? Edinburgh Postnatal Depression Scale - 01/10/22 0859   ? ?  ? Edinburgh Postnatal Depression Scale:  In the Past 7 Days  ? I have been able to laugh and see the funny side of things. 0   ? I have looked forward with enjoyment to things. 0   ? I have blamed myself unnecessarily when things went wrong. 0   ? I have been anxious or worried for no good reason. 0   ? I have felt scared or panicky for no good reason. 0   ? Things have been getting on top of me. 0   ? I have been so unhappy that I have had difficulty sleeping. 0   ? I have felt sad or miserable. 0   ? I have been so unhappy that I have been crying. 0   ? The thought of harming myself has occurred to me. 0   ? Edinburgh Postnatal Depression Scale Total 0   ? ?  ?  ? ?  ? ? ?Health Maintenance Due  ?Topic Date Due  ? HPV VACCINES (1 - 2-dose series) Never done  ? ? ?The following portions of the patient's history were reviewed and updated as appropriate: allergies, current medications, past family history, past medical history, past social history, past surgical history, and problem list. ? ?Review of Systems ?Pertinent items noted in HPI and remainder of comprehensive ROS otherwise  negative. ? ?Objective:  ?BP 136/83   Pulse 79   Ht 5\' 9"  (1.753 m)   Wt 193 lb 8 oz (87.8 kg)   LMP  (LMP Unknown)   Breastfeeding No   BMI 28.57 kg/m?   ? ?General:  alert, cooperative, and appears stated age  ? Breasts:  not indicated  ?Lungs: Comfortable on room air  ?     ?Assessment:  ? ? There are no diagnoses linked to this encounter. ? ?Normal postpartum exam.  ? ?Plan:  ? ?Essential components of care per ACOG recommendations: ? ?1.  Mood and well being: Patient with negative depression screening today. Reviewed local resources for support.  ?- Patient tobacco use? No.   ?- hx of drug use? No.   ? ?2. Infant care and feeding:  ?-Patient currently breastmilk feeding? No.  ?-Social determinants of health (SDOH) reviewed in EPIC. No concerns ? ?3. Sexuality, contraception and birth spacing ?- Patient does not want a pregnancy in the next year.  Desired family size is unsure number of children.  ?- Reviewed reproductive life planning. Reviewed contraceptive methods based on pt preferences and effectiveness.  Patient desired Hormonal Implant today.   ?- Discussed birth spacing of 18 months ? ?4. Sleep and fatigue ?-Encouraged family/partner/community support of 4 hrs of uninterrupted sleep to help with mood and fatigue ? ?  5. Physical Recovery  ?- Discussed patients delivery and complications. She describes her labor as good. ?- Patient had a Vaginal, no problems at delivery. Patient had a  periuretheral  laceration. Perineal healing reviewed. Patient expressed understanding ?- Patient has urinary incontinence? No. ?- Patient is safe to resume physical and sexual activity ? ?6.  Health Maintenance ?- HM due items addressed Yes ?- Last pap smear No results found for: DIAGPAP Pap smear not done at today's visit.  ?-Breast Cancer screening indicated? No.  ? ?7. Chronic Disease/Pregnancy Condition follow up: Hypertension ?Normotensive off Nifed after diagnosis of gHTN. Advised of increased lifetime risk of  HTN, need for regular screening.  ?- PCP follow up ? ?Venora Maples, MD ?Center for [facility], [facility] ? ?

## 2022-01-11 ENCOUNTER — Ambulatory Visit: Payer: 59 | Admitting: Family Medicine

## 2022-02-08 DIAGNOSIS — Z419 Encounter for procedure for purposes other than remedying health state, unspecified: Secondary | ICD-10-CM | POA: Diagnosis not present

## 2022-03-10 DIAGNOSIS — Z419 Encounter for procedure for purposes other than remedying health state, unspecified: Secondary | ICD-10-CM | POA: Diagnosis not present

## 2022-03-21 ENCOUNTER — Other Ambulatory Visit (HOSPITAL_COMMUNITY): Payer: Self-pay

## 2022-03-21 MED ORDER — AMOXICILLIN 500 MG PO CAPS
ORAL_CAPSULE | ORAL | 0 refills | Status: DC
  Filled 2022-03-21: qty 21, 7d supply, fill #0

## 2022-04-10 DIAGNOSIS — Z419 Encounter for procedure for purposes other than remedying health state, unspecified: Secondary | ICD-10-CM | POA: Diagnosis not present

## 2022-05-11 DIAGNOSIS — Z419 Encounter for procedure for purposes other than remedying health state, unspecified: Secondary | ICD-10-CM | POA: Diagnosis not present

## 2022-06-10 DIAGNOSIS — Z419 Encounter for procedure for purposes other than remedying health state, unspecified: Secondary | ICD-10-CM | POA: Diagnosis not present

## 2022-07-11 DIAGNOSIS — Z419 Encounter for procedure for purposes other than remedying health state, unspecified: Secondary | ICD-10-CM | POA: Diagnosis not present

## 2022-08-03 DIAGNOSIS — F17290 Nicotine dependence, other tobacco product, uncomplicated: Secondary | ICD-10-CM | POA: Diagnosis not present

## 2022-08-03 DIAGNOSIS — N764 Abscess of vulva: Secondary | ICD-10-CM | POA: Diagnosis not present

## 2022-08-03 DIAGNOSIS — N751 Abscess of Bartholin's gland: Secondary | ICD-10-CM | POA: Diagnosis not present

## 2022-08-10 DIAGNOSIS — Z419 Encounter for procedure for purposes other than remedying health state, unspecified: Secondary | ICD-10-CM | POA: Diagnosis not present

## 2022-09-10 DIAGNOSIS — Z419 Encounter for procedure for purposes other than remedying health state, unspecified: Secondary | ICD-10-CM | POA: Diagnosis not present

## 2022-09-28 ENCOUNTER — Other Ambulatory Visit (HOSPITAL_BASED_OUTPATIENT_CLINIC_OR_DEPARTMENT_OTHER): Payer: Self-pay

## 2022-10-03 ENCOUNTER — Emergency Department (HOSPITAL_BASED_OUTPATIENT_CLINIC_OR_DEPARTMENT_OTHER)
Admission: EM | Admit: 2022-10-03 | Discharge: 2022-10-03 | Discharge status: Against Medical Advice | Payer: Commercial Managed Care - PPO | Attending: Emergency Medicine | Admitting: Emergency Medicine

## 2022-10-03 ENCOUNTER — Encounter (HOSPITAL_BASED_OUTPATIENT_CLINIC_OR_DEPARTMENT_OTHER): Payer: Self-pay

## 2022-10-03 DIAGNOSIS — R197 Diarrhea, unspecified: Secondary | ICD-10-CM | POA: Diagnosis not present

## 2022-10-03 DIAGNOSIS — R0989 Other specified symptoms and signs involving the circulatory and respiratory systems: Secondary | ICD-10-CM | POA: Diagnosis not present

## 2022-10-03 DIAGNOSIS — Z20822 Contact with and (suspected) exposure to covid-19: Secondary | ICD-10-CM | POA: Diagnosis not present

## 2022-10-03 DIAGNOSIS — J029 Acute pharyngitis, unspecified: Secondary | ICD-10-CM | POA: Diagnosis not present

## 2022-10-03 LAB — RESP PANEL BY RT-PCR (RSV, FLU A&B, COVID)  RVPGX2
Influenza A by PCR: NEGATIVE
Influenza B by PCR: NEGATIVE
Resp Syncytial Virus by PCR: NEGATIVE
SARS Coronavirus 2 by RT PCR: NEGATIVE

## 2022-10-03 NOTE — ED Triage Notes (Signed)
Started having sore throat, diarrhea, runny nose since yesterday. Coworker has covid, here for covid test.

## 2022-10-11 DIAGNOSIS — Z419 Encounter for procedure for purposes other than remedying health state, unspecified: Secondary | ICD-10-CM | POA: Diagnosis not present

## 2022-11-09 DIAGNOSIS — Z419 Encounter for procedure for purposes other than remedying health state, unspecified: Secondary | ICD-10-CM | POA: Diagnosis not present

## 2022-11-18 ENCOUNTER — Other Ambulatory Visit: Payer: Self-pay

## 2022-11-18 ENCOUNTER — Emergency Department (HOSPITAL_COMMUNITY)
Admission: EM | Admit: 2022-11-18 | Discharge: 2022-11-18 | Disposition: A | Discharge status: Home / Self Care | Payer: Commercial Managed Care - PPO | Attending: Emergency Medicine | Admitting: Emergency Medicine

## 2022-11-18 DIAGNOSIS — J020 Streptococcal pharyngitis: Secondary | ICD-10-CM | POA: Insufficient documentation

## 2022-11-18 DIAGNOSIS — J029 Acute pharyngitis, unspecified: Secondary | ICD-10-CM | POA: Diagnosis present

## 2022-11-18 DIAGNOSIS — Z1152 Encounter for screening for COVID-19: Secondary | ICD-10-CM | POA: Insufficient documentation

## 2022-11-18 LAB — RESP PANEL BY RT-PCR (RSV, FLU A&B, COVID)  RVPGX2
Influenza A by PCR: NEGATIVE
Influenza B by PCR: NEGATIVE
Resp Syncytial Virus by PCR: NEGATIVE
SARS Coronavirus 2 by RT PCR: NEGATIVE

## 2022-11-18 LAB — GROUP A STREP BY PCR: Group A Strep by PCR: DETECTED — AB

## 2022-11-18 LAB — PREGNANCY, URINE: Preg Test, Ur: NEGATIVE

## 2022-11-18 MED ORDER — ACETAMINOPHEN 325 MG PO TABS
650.0000 mg | ORAL_TABLET | Freq: Once | ORAL | Status: AC
  Administered 2022-11-18: 650 mg via ORAL
  Filled 2022-11-18: qty 2

## 2022-11-18 MED ORDER — AMOXICILLIN 500 MG PO CAPS: 500.0000 mg | ORAL_CAPSULE | Freq: Two times a day (BID) | ORAL | 0 refills | Status: AC

## 2022-11-18 MED ORDER — AMOXICILLIN 500 MG PO CAPS
500.0000 mg | ORAL_CAPSULE | Freq: Once | ORAL | Status: AC
  Administered 2022-11-18: 500 mg via ORAL
  Filled 2022-11-18: qty 1

## 2022-11-18 NOTE — ED Triage Notes (Signed)
Pt reports sore throat x 2 weeks

## 2022-11-18 NOTE — ED Provider Notes (Signed)
[facility] Provider Note   CSN: [id number] Arrival date & time: 11/18/22  [id number]     History  Chief Complaint  Patient presents with   Sore Throat    Gloria Ochoa is a 22 y.o. female reports is otherwise healthy no daily medication use presented for evaluation of sore throat.  She reports that around 2 weeks ago she had a mild sore throat that lasted 2-3 days, symptoms gradually improved with time and Tylenol.  She felt well over the past week, last night she reports that her sore throat returned she describes a bilateral sharp pain, this is worsens with swallowing no alleviating factors, no medications prior to arrival, pain does not radiate, pain is mild-moderate.  She denies fever, difficulty swallowing, voice change, facial/neck swelling, nausea, vomiting, abdominal pain, diarrhea, cough or any additional concerns.  HPI     Home Medications Prior to Admission medications   Medication Sig Start Date End Date Taking? Authorizing Provider  amoxicillin (AMOXIL) 500 MG capsule Take 1 capsule (500 mg total) by mouth 2 (two) times daily for 10 days. 11/18/22 11/28/22 Yes Nuala Alpha A, PA-C      Allergies    Patient has no known allergies.    Review of Systems   Review of Systems  Constitutional:  Negative for chills and fever.  HENT:  Positive for sore throat. Negative for facial swelling, trouble swallowing and voice change.   Respiratory: Negative.  Negative for cough.   Gastrointestinal: Negative.  Negative for abdominal pain, diarrhea and vomiting.  Musculoskeletal: Negative.  Negative for neck pain.    Physical Exam Updated Vital Signs BP 128/76 (BP Location: Left Arm)   Pulse 90   Temp 98.5 F (36.9 C) (Oral)   Resp 18   Ht '5\' 8"'$  (1.727 m)   Wt 105 kg   SpO2 100%   BMI 35.20 kg/m  Physical Exam Constitutional:      General: She is not in acute distress.    Appearance: Normal appearance. She is well-developed.  She is not ill-appearing or diaphoretic.  HENT:     Head: Normocephalic and atraumatic.     Jaw: There is normal jaw occlusion. No trismus.     Right Ear: External ear normal.     Left Ear: External ear normal.     Nose: Nose normal.     Mouth/Throat:     Comments: 1+ symmetric bilateral tonsillar swelling, mild erythema, no exudate.    The patient has normal phonation and is in control of secretions. No stridor.  Midline uvula without edema. Soft palate rises symmetrically. Tongue protrusion is normal, floor of mouth is soft. No trismus. No creptius on neck palpation. No gingival erythema or fluctuance noted. Mucus membranes moist. No pallor noted.  Eyes:     General: Vision grossly intact. Gaze aligned appropriately.     Pupils: Pupils are equal, round, and reactive to light.  Neck:     Trachea: Trachea and phonation normal.  Pulmonary:     Effort: Pulmonary effort is normal. No respiratory distress.  Abdominal:     General: There is no distension.     Palpations: Abdomen is soft.     Tenderness: There is no abdominal tenderness. There is no guarding or rebound.  Musculoskeletal:        General: Normal range of motion.     Cervical back: Normal range of motion.  Skin:    General: Skin is warm and  dry.  Neurological:     Mental Status: She is alert.     GCS: GCS eye subscore is 4. GCS verbal subscore is 5. GCS motor subscore is 6.     Comments: Speech is clear and goal oriented, follows commands Major Cranial nerves without deficit, no facial droop Moves extremities without ataxia, coordination intact  Psychiatric:        Behavior: Behavior normal.     ED Results / Procedures / Treatments   Labs (all labs ordered are listed, but only abnormal results are displayed) Labs Reviewed  GROUP A STREP BY PCR - Abnormal; Notable for the following components:      Result Value   Group A Strep by PCR DETECTED (*)    All other components within normal limits  RESP PANEL BY RT-PCR  (RSV, FLU A&B, COVID)  [id number]  PREGNANCY, URINE  POC URINE PREG, ED    EKG None  Radiology No results found.  Procedures Procedures    Medications Ordered in ED Medications  acetaminophen (TYLENOL) tablet 650 mg (650 mg Oral Given 11/18/22 1014)  amoxicillin (AMOXIL) capsule 500 mg (500 mg Oral Given 11/18/22 1059)    ED Course/ Medical Decision Making/ A&P Clinical Course as of 11/18/22 1103  Sun Nov 18, 2022  1012 Group A Strep by PCR(!) Strep positive.  Will treat amoxicillin 500 mg twice daily x 10 days.  Monospot discontinued. [BM]  1036 Resp panel by RT-PCR (RSV, Flu A&B, Covid) Anterior Nasal Swab COVID, RSV and influenza panel negative. [BM]  1055 Pregnancy, urine Pregnancy test negative [BM]    Clinical Course User Index [BM] Deliah Boston, PA-C                             Medical Decision Making 22 year old female reports is otherwise healthy no daily medication use presented for evaluation of sore throat that began last night.  She has not tried any medications prior to arrival.  She does report history of sore throat around 2 weeks ago which resolved with Tylenol.  On exam she is well-appearing and in no acute distress she has mild tonsillar erythema and 1+ swelling bilaterally.  Uvula is midline, no exudate.  No evidence to suggest PTA, RPA, Ludwig's, dental abscess, sinusitis or other deep space infections.  Suspect patient with tonsillitis viral versus strep.  Will obtain COVID/influenza/RSV panel, strep test and Monospot test.  Will provide patient with Tylenol given she has not taken anything since symptoms onset last night.  No other concerns per patient at this time.  Vital signs stable, low suspicion for sepsis.  Amount and/or Complexity of Data Reviewed Labs: ordered. Decision-making details documented in ED Course.    Details: Strep test positive.  Risk OTC drugs. Prescription drug management. Risk Details: Patient found to be strep positive.   She was initiated on treatment with amoxicillin 500 mg twice daily x 10 days.  She will continue home symptomatic remedies as well.  ER precautions were discussed.  All questions were answered.   Low suspicion for PTA, RPA, Ludwick's or other deep space infections at this time.  No indication for CT imaging or blood work.  Patient appears stable for discharge.  ER return precautions discussed.   At this time there does not appear to be any evidence of an acute emergency medical condition and the patient appears stable for discharge with appropriate outpatient follow up. Diagnosis was discussed with patient who  verbalizes understanding of care plan and is agreeable to discharge. I have discussed return precautions with patient who verbalizes understanding. Patient encouraged to follow-up with their PCP. All questions answered.  Patient's case discussed with Dr. Mayra Neer who agrees with plan to discharge with follow-up.   Note: Portions of this report may have been transcribed using voice recognition software. Every effort was made to ensure accuracy; however, inadvertent computerized transcription errors may still be present.         Final Clinical Impression(s) / ED Diagnoses Final diagnoses:  Strep pharyngitis    Rx / DC Orders ED Discharge Orders          Ordered    amoxicillin (AMOXIL) 500 MG capsule  2 times daily        11/18/22 1102              Deliah Boston, [place] 11/18/22 1106    Audley Hose, MD 11/18/22 1257

## 2022-11-18 NOTE — Discharge Instructions (Addendum)
At this time there does not appear to be the presence of an emergent medical condition, however there is always the potential for conditions to change. Please read and follow the below instructions.  Please return to the Emergency Department immediately for any new or worsening symptoms or if your symptoms do not improve within 3 days. Please be sure to follow up with your Primary Care Provider within one week regarding your visit today; please call their office to schedule an appointment even if you are feeling better for a follow-up visit. Please take your antibiotic Amoxicillin as prescribed until complete to help with your symptoms.  Please drink enough water to avoid dehydration and get plenty of rest. Please reach out to [facility] health community health wellness to establish a primary care provider for recheck in the next week.   Please read the additional information packets attached to your discharge summary.  Go to the nearest Emergency Department immediately if: You have fever or chills You vomit. You have a very bad headache. Your neck hurts or feels stiff. You have chest pain or are short of breath. You have drooling, very bad throat pain, or changes in your voice. Your neck is swollen, or the skin gets red and tender. Your mouth is dry, or you are peeing less than normal. You keep feeling more tired or have trouble waking up. Your joints are red or painful. You have any new/concerning or worsening of symptoms.  Do not take your medicine if  develop an itchy rash, swelling in your mouth or lips, or difficulty breathing; call 911 and seek immediate emergency medical attention if this occurs.  You may review your lab tests and imaging results in their entirety on your MyChart account.  Please discuss all results of fully with your primary care provider and other specialist at your follow-up visit.  Note: Portions of this text may have been transcribed using voice recognition  software. Every effort was made to ensure accuracy; however, inadvertent computerized transcription errors may still be present.

## 2022-12-10 DIAGNOSIS — Z419 Encounter for procedure for purposes other than remedying health state, unspecified: Secondary | ICD-10-CM | POA: Diagnosis not present

## 2023-01-09 DIAGNOSIS — Z419 Encounter for procedure for purposes other than remedying health state, unspecified: Secondary | ICD-10-CM | POA: Diagnosis not present

## 2023-01-14 ENCOUNTER — Other Ambulatory Visit: Payer: Self-pay

## 2023-01-14 ENCOUNTER — Emergency Department (HOSPITAL_BASED_OUTPATIENT_CLINIC_OR_DEPARTMENT_OTHER)
Admission: EM | Admit: 2023-01-14 | Discharge: 2023-01-14 | Disposition: A | Discharge status: Home / Self Care | Payer: Commercial Managed Care - PPO | Attending: Emergency Medicine | Admitting: Emergency Medicine

## 2023-01-14 ENCOUNTER — Encounter (HOSPITAL_BASED_OUTPATIENT_CLINIC_OR_DEPARTMENT_OTHER): Payer: Self-pay | Admitting: Emergency Medicine

## 2023-01-14 DIAGNOSIS — R1084 Generalized abdominal pain: Secondary | ICD-10-CM | POA: Diagnosis present

## 2023-01-14 DIAGNOSIS — R112 Nausea with vomiting, unspecified: Secondary | ICD-10-CM | POA: Diagnosis not present

## 2023-01-14 DIAGNOSIS — F12188 Cannabis abuse with other cannabis-induced disorder: Secondary | ICD-10-CM | POA: Insufficient documentation

## 2023-01-14 LAB — URINALYSIS, ROUTINE W REFLEX MICROSCOPIC
Glucose, UA: NEGATIVE mg/dL
Hgb urine dipstick: NEGATIVE
Ketones, ur: 15 mg/dL — AB
Leukocytes,Ua: NEGATIVE
Nitrite: NEGATIVE
Protein, ur: 100 mg/dL — AB
Specific Gravity, Urine: >=1.03 (ref 1.005–1.030)
pH: 6 (ref 5.0–8.0)

## 2023-01-14 LAB — URINALYSIS, MICROSCOPIC (REFLEX)

## 2023-01-14 LAB — COMPREHENSIVE METABOLIC PANEL
ALT: 23 U/L (ref 0–44)
AST: 25 U/L (ref 15–41)
Albumin: 4.1 g/dL (ref 3.5–5.0)
Alkaline Phosphatase: 63 U/L (ref 38–126)
Anion gap: 11 (ref 5–15)
BUN: 14 mg/dL (ref 6–20)
CO2: 20 mmol/L — ABNORMAL LOW (ref 22–32)
Calcium: 8.7 mg/dL — ABNORMAL LOW (ref 8.9–10.3)
Chloride: 107 mmol/L (ref 98–111)
Creatinine, Ser: 1.1 mg/dL — ABNORMAL HIGH (ref 0.44–1.00)
GFR, Estimated: >60 mL/min (ref >60)
Glucose, Bld: 137 mg/dL — ABNORMAL HIGH (ref 70–99)
Potassium: 2.9 mmol/L — ABNORMAL LOW (ref 3.5–5.1)
Sodium: 138 mmol/L (ref 135–145)
Total Bilirubin: 0.7 mg/dL (ref 0.3–1.2)
Total Protein: 8.5 g/dL — ABNORMAL HIGH (ref 6.5–8.1)

## 2023-01-14 LAB — LIPASE, BLOOD: Lipase: 34 U/L (ref 11–51)

## 2023-01-14 LAB — CBC
HCT: 39.4 % (ref 36.0–46.0)
Hemoglobin: 12.7 g/dL (ref 12.0–15.0)
MCH: 26.9 pg (ref 26.0–34.0)
MCHC: 32.2 g/dL (ref 30.0–36.0)
MCV: 83.5 fL (ref 80.0–100.0)
Platelets: 339 10*3/uL (ref 150–400)
RBC: 4.72 MIL/uL (ref 3.87–5.11)
RDW: 16 % — ABNORMAL HIGH (ref 11.5–15.5)
WBC: 20.4 10*3/uL — ABNORMAL HIGH (ref 4.0–10.5)
nRBC: 0 % (ref 0.0–0.2)

## 2023-01-14 LAB — PREGNANCY, URINE: Preg Test, Ur: NEGATIVE

## 2023-01-14 LAB — MAGNESIUM: Magnesium: 2 mg/dL (ref 1.7–2.4)

## 2023-01-14 MED ORDER — CAPSAICIN 0.075 % EX CREA
TOPICAL_CREAM | Freq: Once | CUTANEOUS | Status: AC
  Filled 2023-01-14: qty 60

## 2023-01-14 MED ORDER — POTASSIUM CHLORIDE CRYS ER 20 MEQ PO TBCR
40.0000 meq | EXTENDED_RELEASE_TABLET | Freq: Once | ORAL | Status: AC
  Administered 2023-01-14: 40 meq via ORAL
  Filled 2023-01-14: qty 2

## 2023-01-14 MED ORDER — SODIUM CHLORIDE 0.9 % IV BOLUS
1000.0000 mL | Freq: Once | INTRAVENOUS | Status: AC
  Administered 2023-01-14: 1000 mL via INTRAVENOUS

## 2023-01-14 MED ORDER — DROPERIDOL 2.5 MG/ML IJ SOLN
1.2500 mg | Freq: Once | INTRAMUSCULAR | Status: AC
  Administered 2023-01-14: 1.25 mg via INTRAVENOUS
  Filled 2023-01-14: qty 2

## 2023-01-14 MED ORDER — ONDANSETRON 4 MG PO TBDP: ORAL_TABLET | ORAL | 0 refills | Status: DC

## 2023-01-14 MED ORDER — DIPHENHYDRAMINE HCL 50 MG/ML IJ SOLN
25.0000 mg | Freq: Once | INTRAMUSCULAR | Status: AC
  Administered 2023-01-14: 25 mg via INTRAVENOUS
  Filled 2023-01-14: qty 1

## 2023-01-14 MED ORDER — PROMETHAZINE HCL 25 MG RE SUPP: 25.0000 mg | Freq: Four times a day (QID) | RECTAL | 0 refills | Status: DC | PRN

## 2023-01-14 MED ORDER — POTASSIUM CHLORIDE 10 MEQ/100ML IV SOLN
10.0000 meq | Freq: Once | INTRAVENOUS | Status: AC
  Administered 2023-01-14: 10 meq via INTRAVENOUS
  Filled 2023-01-14: qty 100

## 2023-01-14 MED ORDER — ONDANSETRON HCL 4 MG/2ML IJ SOLN
4.0000 mg | Freq: Once | INTRAMUSCULAR | Status: AC
  Administered 2023-01-14: 4 mg via INTRAVENOUS
  Filled 2023-01-14: qty 2

## 2023-01-14 NOTE — ED Triage Notes (Signed)
Abd pain 2 days ago with emesis, got some relief yesterday, recurrent emesis today at 5 am . Denies diarrhea

## 2023-01-14 NOTE — ED Provider Notes (Signed)
[facility] Provider Note   CSN: [id number] Arrival date & time: 01/14/23  4098     History  Chief Complaint  Patient presents with   Abdominal Pain   Emesis    Gloria Ochoa is a 22 y.o. female.  21 yo F with a chief complaints of diffuse abdominal pain nausea and vomiting.  This been going on for couple days.  She tells me she is actually had recurrent issues with vomiting since she was 22 years old.  Seems to be coinciding with her menses.  She denies any fevers.  Hot showers seem to make this better.  Eating and drinking seems to make it worse.  Denies urinary symptoms denies constipation.  Denies diarrhea.   Abdominal Pain Associated symptoms: vomiting   Emesis Associated symptoms: abdominal pain        Home Medications Prior to Admission medications   Medication Sig Start Date End Date Taking? Authorizing Provider  ondansetron (ZOFRAN-ODT) 4 MG disintegrating tablet 4mg  ODT q4 hours prn nausea/vomit 01/14/23  Yes Melene Plan, DO  promethazine (PHENERGAN) 25 MG suppository Place 1 suppository (25 mg total) rectally every 6 (six) hours as needed for nausea or vomiting. 01/14/23  Yes Melene Plan, DO      Allergies    Patient has no known allergies.    Review of Systems   Review of Systems  Gastrointestinal:  Positive for abdominal pain and vomiting.    Physical Exam Updated Vital Signs BP (!) 158/107   Pulse (!) 50   Temp 98.2 F (36.8 C) (Oral)   Wt 105 kg   SpO2 100%   BMI 35.20 kg/m  Physical Exam Vitals and nursing note reviewed.  Constitutional:      General: She is not in acute distress.    Appearance: She is well-developed. She is not diaphoretic.     Comments: Smells of marijuana, BMI35  HENT:     Head: Normocephalic and atraumatic.  Eyes:     Pupils: Pupils are equal, round, and reactive to light.  Cardiovascular:     Rate and Rhythm: Normal rate and regular rhythm.     Heart sounds: No murmur  heard.    No friction rub. No gallop.  Pulmonary:     Effort: Pulmonary effort is normal.     Breath sounds: No wheezing or rales.  Abdominal:     General: There is no distension.     Palpations: Abdomen is soft.     Tenderness: There is no abdominal tenderness.  Musculoskeletal:        General: No tenderness.     Cervical back: Normal range of motion and neck supple.  Skin:    General: Skin is warm and dry.  Neurological:     Mental Status: She is alert and oriented to person, place, and time.  Psychiatric:        Behavior: Behavior normal.     ED Results / Procedures / Treatments   Labs (all labs ordered are listed, but only abnormal results are displayed) Labs Reviewed  COMPREHENSIVE METABOLIC PANEL - Abnormal; Notable for the following components:      Result Value   Potassium 2.9 (*)    CO2 20 (*)    Glucose, Bld 137 (*)    Creatinine, Ser 1.10 (*)    Calcium 8.7 (*)    Total Protein 8.5 (*)    All other components within normal limits  CBC - Abnormal; Notable  for the following components:   WBC 20.4 (*)    RDW 16.0 (*)    All other components within normal limits  URINALYSIS, ROUTINE W REFLEX MICROSCOPIC - Abnormal; Notable for the following components:   APPearance HAZY (*)    Bilirubin Urine SMALL (*)    Ketones, ur 15 (*)    Protein, ur 100 (*)    All other components within normal limits  URINALYSIS, MICROSCOPIC (REFLEX) - Abnormal; Notable for the following components:   Bacteria, UA FEW (*)    Non Squamous Epithelial PRESENT (*)    All other components within normal limits  LIPASE, BLOOD  PREGNANCY, URINE  MAGNESIUM    EKG None  Radiology No results found.  Procedures Procedures    Medications Ordered in ED Medications  sodium chloride 0.9 % bolus 1,000 mL (0 mLs Intravenous Stopped 01/14/23 0953)  ondansetron (ZOFRAN) injection 4 mg (4 mg Intravenous Given 01/14/23 0836)  droperidol (INAPSINE) 2.5 MG/ML injection 1.25 mg (1.25 mg Intravenous  Given 01/14/23 0843)  diphenhydrAMINE (BENADRYL) injection 25 mg (25 mg Intravenous Given 01/14/23 0843)  capsicum (ZOSTRIX) 0.075 % cream ( Topical Given 01/14/23 0843)  potassium chloride 10 mEq in 100 mL IVPB (0 mEq Intravenous Stopped 01/14/23 1030)  potassium chloride SA (KLOR-CON M) CR tablet 40 mEq (40 mEq Oral Given 01/14/23 0928)  sodium chloride 0.9 % bolus 1,000 mL (0 mLs Intravenous Stopped 01/14/23 1044)    ED Course/ Medical Decision Making/ A&P                             Medical Decision Making Amount and/or Complexity of Data Reviewed Labs: ordered.  Risk OTC drugs. Prescription drug management.   22 yo F with a chief complaints of nausea vomiting going on for about 3 days now.  By history it seems the most likely diagnosis would be cannabinoid hyperemesis syndrome.  Patient smells very strongly of marijuana and tells me that she has had recurrent vomiting that improves with warm showers.  My nursing as this is almost pathognomonic for this.  I discussed the likely diagnosis with the patient and she adamantly disagrees.  Will treat her symptoms here.  Mild hypokalemia.  Replenished.  Magnesium normal.  UA on my independent interpretation without infection.  Trace ketones.  Patient feeling better.  Tolerate by mouth.  Will discharge home.  11:43 AM:  I have discussed the diagnosis/risks/treatment options with the patient and family.  Evaluation and diagnostic testing in the emergency department does not suggest an emergent condition requiring admission or immediate intervention beyond what has been performed at this time.  They will follow up with PCP. We also discussed returning to the ED immediately if new or worsening sx occur. We discussed the sx which are most concerning (e.g., sudden worsening pain, fever, inability to tolerate by mouth) that necessitate immediate return. Medications administered to the patient during their visit and any new prescriptions provided to the patient  are listed below.  Medications given during this visit Medications  sodium chloride 0.9 % bolus 1,000 mL (0 mLs Intravenous Stopped 01/14/23 0953)  ondansetron (ZOFRAN) injection 4 mg (4 mg Intravenous Given 01/14/23 0836)  droperidol (INAPSINE) 2.5 MG/ML injection 1.25 mg (1.25 mg Intravenous Given 01/14/23 0843)  diphenhydrAMINE (BENADRYL) injection 25 mg (25 mg Intravenous Given 01/14/23 0843)  capsicum (ZOSTRIX) 0.075 % cream ( Topical Given 01/14/23 0843)  potassium chloride 10 mEq in 100 mL IVPB (0 mEq  Intravenous Stopped 01/14/23 1030)  potassium chloride SA (KLOR-CON M) CR tablet 40 mEq (40 mEq Oral Given 01/14/23 0928)  sodium chloride 0.9 % bolus 1,000 mL (0 mLs Intravenous Stopped 01/14/23 1044)     The patient appears reasonably screen and/or stabilized for discharge and I doubt any other medical condition or other [facility] requiring further screening, evaluation, or treatment in the ED at this time prior to discharge.          Final Clinical Impression(s) / ED Diagnoses Final diagnoses:  Cannabinoid hyperemesis syndrome    Rx / DC Orders ED Discharge Orders          Ordered    ondansetron (ZOFRAN-ODT) 4 MG disintegrating tablet        01/14/23 1017    promethazine (PHENERGAN) 25 MG suppository  Every 6 hours PRN        01/14/23 1017              [place], DO 01/14/23 1143

## 2023-01-14 NOTE — Discharge Instructions (Signed)
Cannabinoid hyperemesis syndrome (CHS) We have written this factsheet to give you more information about cannabinoid  hyperemesis syndrome (CHS). It explains what cannabinoid hyperemesis syndrome is,  the symptoms to look out for and how it is treated. We hope it will help to answer some  of the questions you may have. If you have any further questions or concerns, please  speak to a member of your healthcare team. What is cannabinoid hyperemesis syndrome? Cannabinoid hyperemesis syndrome (CHS) is a rare condition caused by a regular (daily) and  long-term use of marijuana. The syndrome is characterised by repeated and severe bouts of  vomiting.  Marijuana has several active substances. These substances bind to molecules found in the  body and affect the way they work. For example, they affect the molecules found in the brain  and cause the drug 'high' that users feel. They also affect the way the molecules in the gut  function and can change the time it takes the stomach to empty. The drug also affects the  oesophageal sphincter, which is the tight band of muscle that opens and closes to let food  pass from the oesophagus (food pipe) into the stomach. The effects of marijuana on your  digestive system are what lead to the main symptoms of CHS. What causes CHS? Marijuana is a complex substance that affects everyone differently. The main ingredient in  marijuana is called THC, which has anti-nausea (anti-sickness) effects. This is why marijuana  is regularly prescribed for nausea caused by chemotherapy treatment. However, if you use  it over a long period of time, marijuana seems to have the opposite effect on the digestive  system and makes you more likely to feel and be sick.  Research is being carried out to explain why only some long-term users of marijuana  experience CHS. What are the symptoms? Symptoms are divided into three stages: Prodromal phase This is the first phase and  symptoms may include early morning nausea, tummy (abdominal)  pain and a fear of vomiting. During this phase, most people will keep to their normal eating  patterns. Some people may continue to use marijuana because they think it will help stop the  nausea. This phase may last for weeks, months or years. Hyperemetic phase Symptoms may include:  ongoing nausea (feeling sick)  repeated episodes of vomiting www.uhs.nhs.uk Patient information factsheet  tummy pain  reduced food intake and weight loss  dehydration During this phase, vomiting may often be intense and overwhelming. Many people will take a  lot of hot showers during the day to ease their nausea. The hyperemetic phase will continue  until you stop using marijuana.  Recovery phase The recovery phase will begin once you stop using marijuana. During this phase, the  symptoms will go away and you will be able to eat normally again. This phase can last days or  months. If you try marijuana again, your symptoms are likely to return. When should I call my healthcare provider? Call your healthcare provider if you have had severe vomiting for a day or more. How is CHS diagnosed? Many health problems can cause repeated vomiting. To make a diagnosis, your healthcare  provider will ask you about your symptoms and your past health. They will also perform a  physical exam, including an examination of your tummy. Your healthcare provider may also do further tests to rule out other causes of vomiting.  Admitting to your healthcare provider that you use marijuana daily can speed up  the  diagnosis. How is CHS treated? If you have had severe vomiting, you may need to stay in hospital for a short time. During the  hyperemetic phase, you may need the following treatments:  IV (intravenous) fluid replacement for dehydration  anti-sickness medicines   pain-relief medicines  proton-pump inhibitors (to treat stomach inflammation)  frequent hot  showers  Capsaicin cream (to reduce pain and nausea) Symptoms will often ease after a day or two, unless marijuana is used again. In order to fully recover, you will need to stop using marijuana all together. Some people may  need support from drug rehab programmes to help them quit. Cognitive behavioural therapy  (CBT) or family therapy may also help. If you stop using marijuana, your symptoms should not  come back. What are the possible complications of CHS? Very severe, prolonged vomiting may lead to dehydration. It may also lead to electrolyte  problems in your blood. If left untreated, these can cause rare complications, such as:  muscle spasms or weakness  seizures  kidney failure  heart rhythm abnormalities  shock  brain swelling (cerebral oedema) www.uhs.nhs.uk Patient information factsheet What can I do to prevent CHS? Only stopping marijuana use completely will prevent CHS. Cutting down your use will not get  rid of CHS.  You may not want to believe that marijuana may be the underlying cause of your symptoms.  This might be because you have used it for many years without having any problems, but  CHS can take several years to develop.  Quitting marijuana may lead to other health benefits, including:  better lung function  improved memory and thinking skills  better sleep  reduced risk of depression and anxiety

## 2023-02-09 DIAGNOSIS — Z419 Encounter for procedure for purposes other than remedying health state, unspecified: Secondary | ICD-10-CM | POA: Diagnosis not present

## 2023-03-10 IMAGING — US US OB TRANSVAGINAL
1 series · 15 of 28 positions shown · non-contrast
Comparison: November 12, 2020

CLINICAL DATA: Abdominal pain, status post abortion.

EXAM:
OBSTETRIC <14 WK US AND TRANSVAGINAL OB US
TECHNIQUE: Both transabdominal and transvaginal ultrasound examinations were
performed for complete evaluation of the gestation as well as the
maternal uterus, adnexal regions, and pelvic cul-de-sac.
Transvaginal technique was performed to assess early pregnancy.

[Series 1: us ob transvaginal · 15 of 30 slices shown]
[im 1/30]
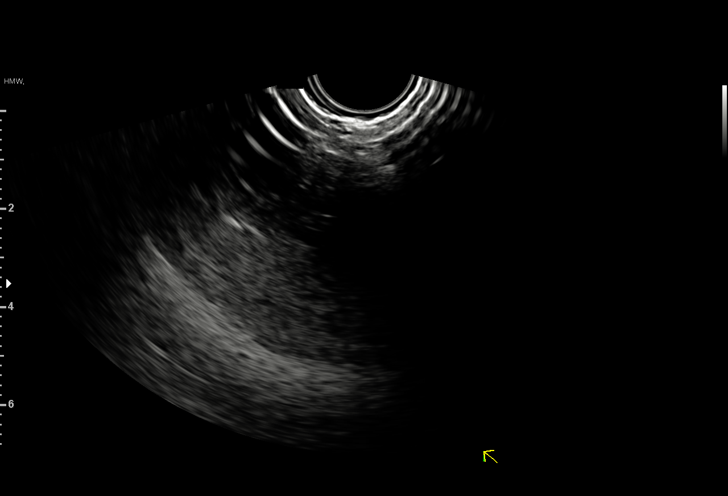
[im 3/30]
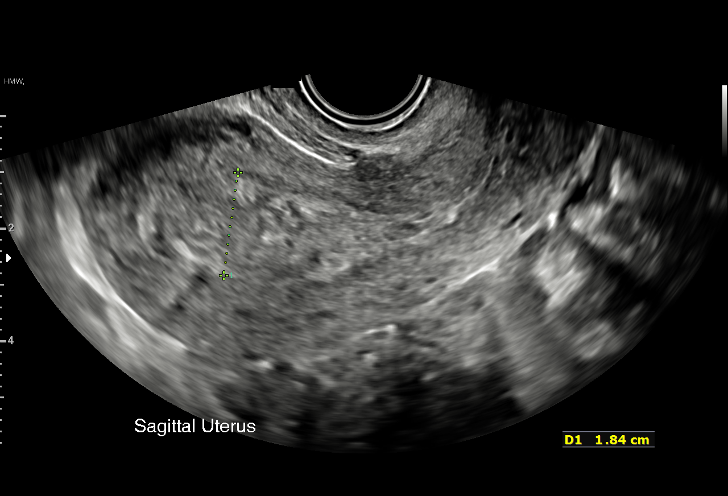
[im 5/30]
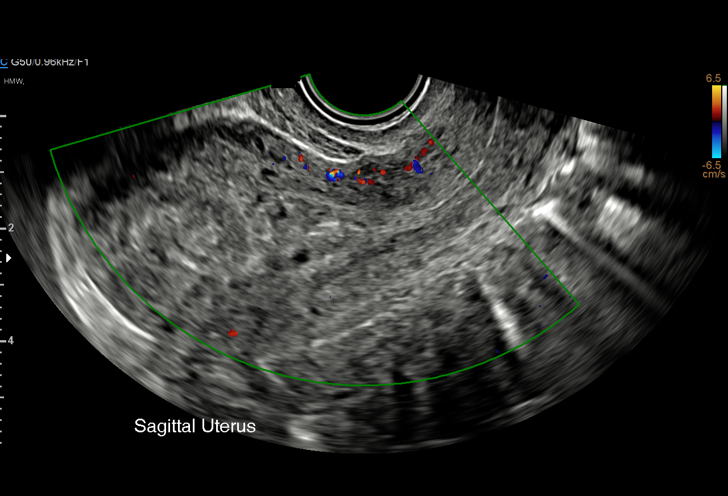
[im 7/30]
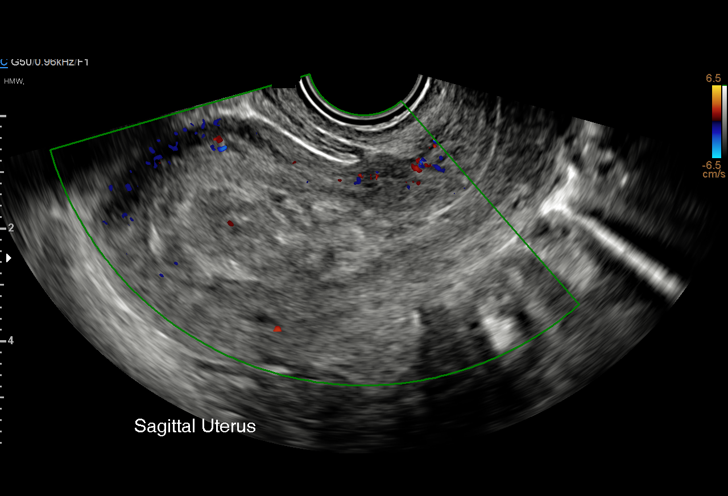
[im 9/30]
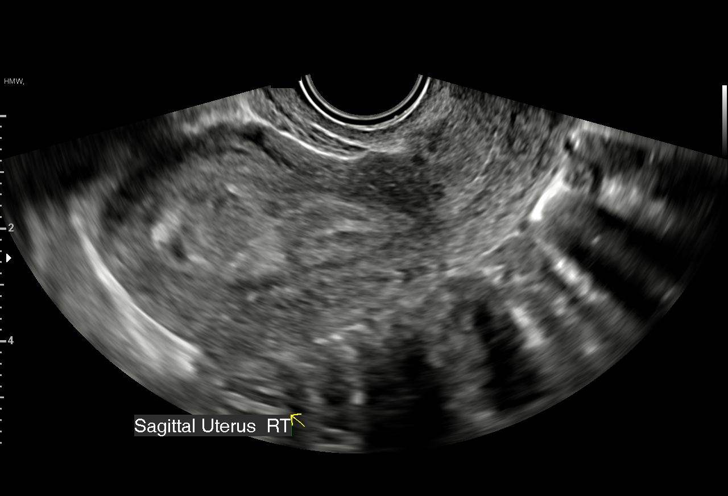
[im 11/30]
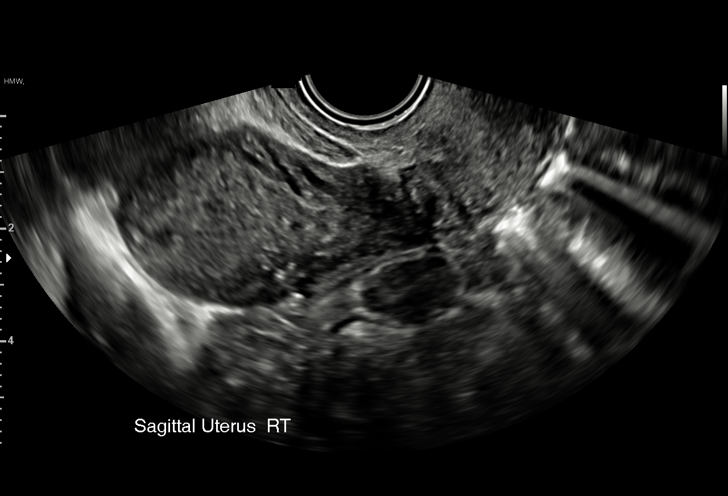
[im 13/30]
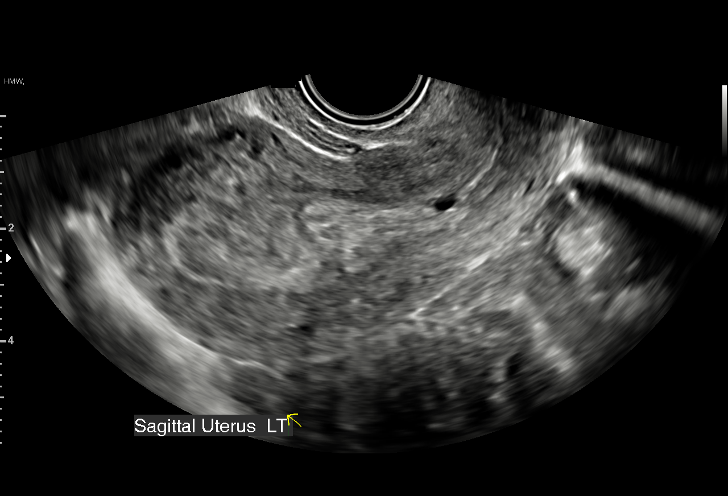
[im 16/30]
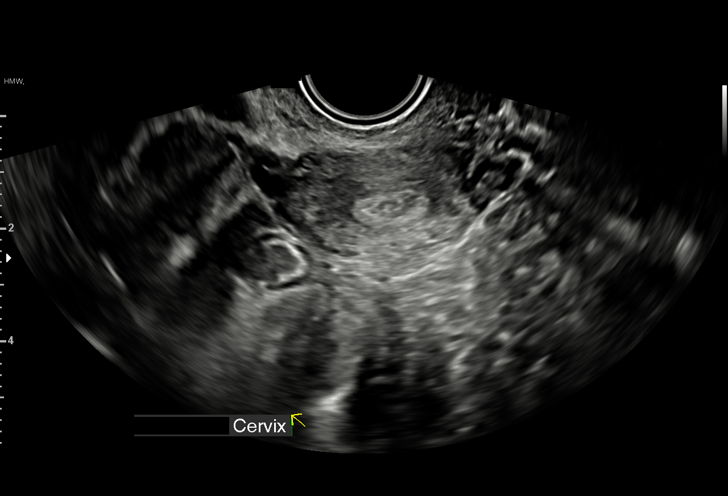
[im 17/30]
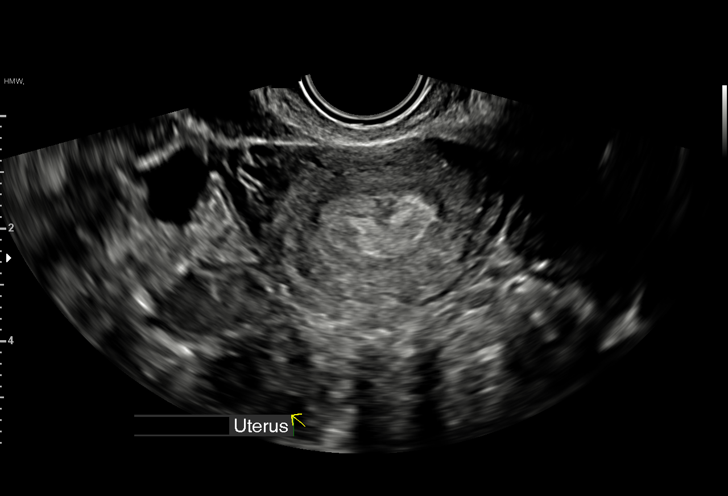
[im 19/30]
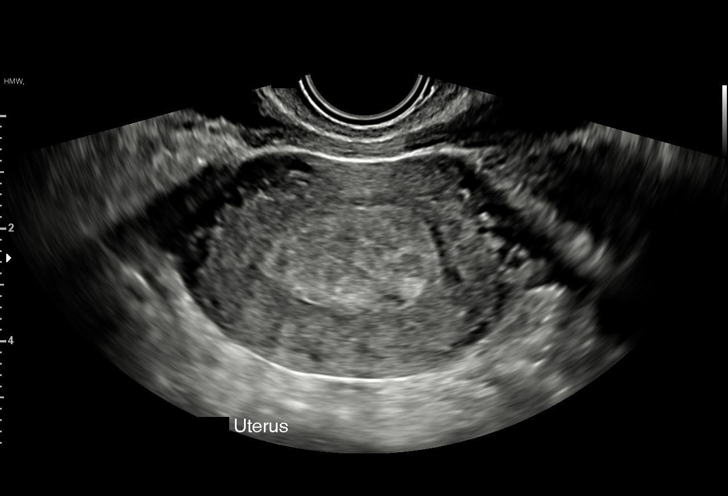
[im 21/30]
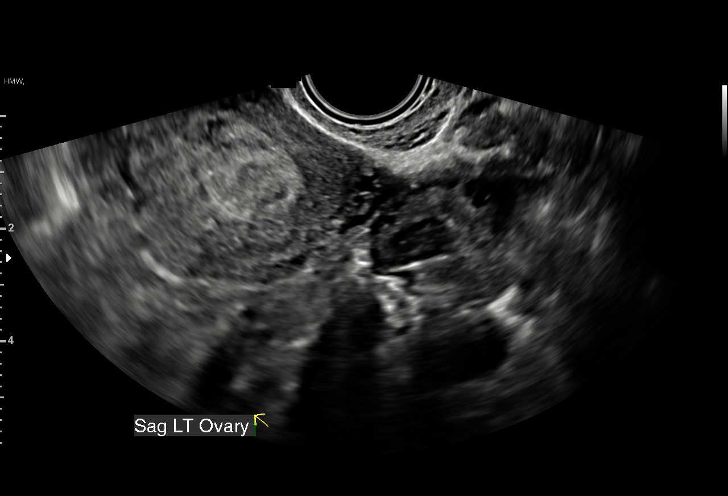
[im 23/30]
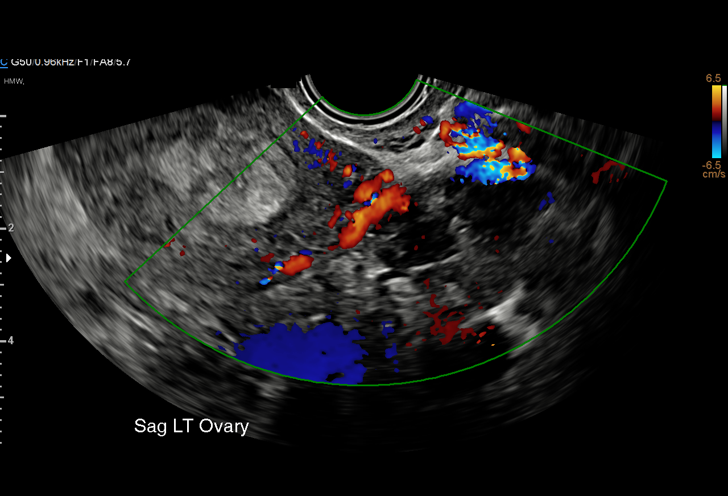
[im 25/30]
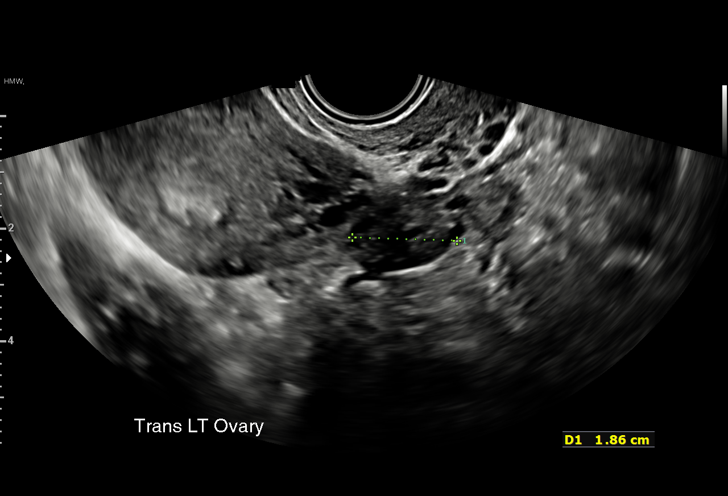
[im 27/30]
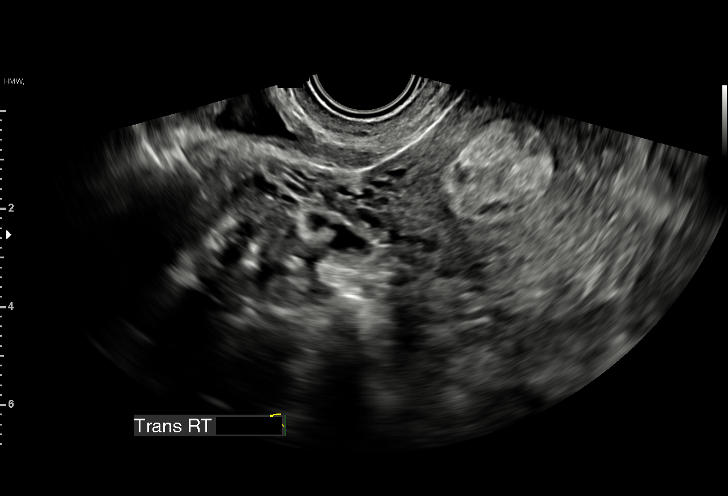
[im 30/30]
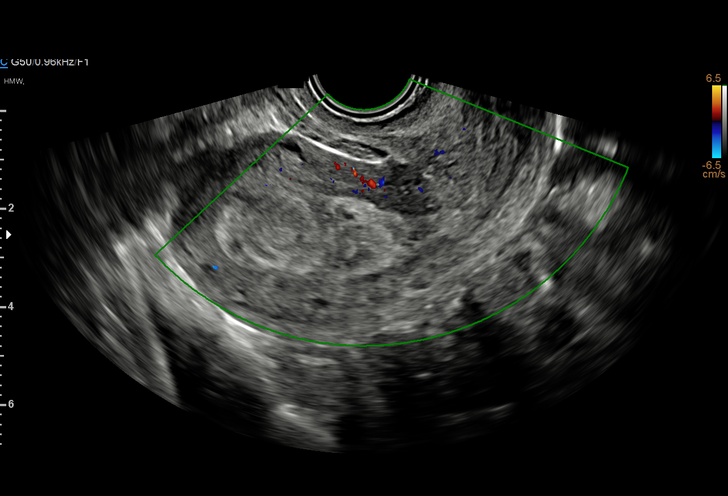

[15 of 28 positions shown; findings below may reference images not displayed]

FINDINGS: Intrauterine gestational sac: None

Yolk sac:  Not Visualized.

Embryo:  Not Visualized.

Cardiac Activity: Not Visualized.

Heart Rate: N/A  bpm

Subchorionic hemorrhage:  None visualized.

Maternal uterus/adnexae: The endometrium measures 21 mm in
thickness. This is decreased in size when compared to the prior
study. No abnormal flow is seen within this region on color Doppler
evaluation.

The right ovary is not visualized.

The left ovary is visualized and is normal in appearance.

No pelvic free fluid is seen.
IMPRESSION: Thickened endometrium without evidence of an intrauterine pregnancy
or retained products of conception. MRI correlation is recommended
if clinical symptoms persist.

## 2023-03-11 DIAGNOSIS — Z419 Encounter for procedure for purposes other than remedying health state, unspecified: Secondary | ICD-10-CM | POA: Diagnosis not present

## 2023-04-11 DIAGNOSIS — Z419 Encounter for procedure for purposes other than remedying health state, unspecified: Secondary | ICD-10-CM | POA: Diagnosis not present

## 2023-05-12 DIAGNOSIS — Z419 Encounter for procedure for purposes other than remedying health state, unspecified: Secondary | ICD-10-CM | POA: Diagnosis not present

## 2023-06-11 DIAGNOSIS — Z419 Encounter for procedure for purposes other than remedying health state, unspecified: Secondary | ICD-10-CM | POA: Diagnosis not present

## 2023-07-12 DIAGNOSIS — Z419 Encounter for procedure for purposes other than remedying health state, unspecified: Secondary | ICD-10-CM | POA: Diagnosis not present

## 2023-08-11 DIAGNOSIS — Z419 Encounter for procedure for purposes other than remedying health state, unspecified: Secondary | ICD-10-CM | POA: Diagnosis not present

## 2024-02-14 ENCOUNTER — Other Ambulatory Visit: Payer: Self-pay

## 2024-02-14 ENCOUNTER — Emergency Department (HOSPITAL_COMMUNITY): Admission: EM | Admit: 2024-02-14 | Discharge: 2024-02-14 | Discharge status: Against Medical Advice

## 2024-02-14 DIAGNOSIS — Z5321 Procedure and treatment not carried out due to patient leaving prior to being seen by health care provider: Secondary | ICD-10-CM | POA: Diagnosis not present

## 2024-02-14 DIAGNOSIS — R519 Headache, unspecified: Secondary | ICD-10-CM | POA: Diagnosis not present

## 2024-02-14 MED ORDER — ACETAMINOPHEN 325 MG PO TABS: 650.0000 mg | ORAL_TABLET | Freq: Once | ORAL | Status: DC

## 2024-02-14 NOTE — ED Notes (Signed)
 Patient said she is not waiting she would rather go home and sleep on the couch.

## 2024-02-14 NOTE — ED Provider Triage Note (Signed)
 Emergency Medicine Provider Triage Evaluation Note  Gloria Ochoa , a 23 y.o. female  was evaluated in triage.  Pt complains of headache that started at 9am this morning, then drank a coffee and headache got worse after. Denies nausea, denies vomiting. Denies fever, chills, chest pain, abdominal pain, shortness of breath. Denies medicine at home. Took Excedrin migraine relief at around 12pm, patient states pain is improving but still there. Denies blurry vision, dizziness.   Review of Systems  Positive: Headache  Negative: Nausea, vomiting, chest pain, abdominal pain, shortness of breath, no weakness   Physical Exam  BP (!) 150/88 (BP Location: Left Arm)   Pulse 70   Temp 98.8 F (37.1 C) (Oral)   Resp 18   Ht 5\' 9"  (1.753 m)   Wt 104.3 kg   SpO2 98%   BMI 33.97 kg/m  Gen:   Awake, no distress   Resp:  Normal effort, heart sounds normal  MSK:   Moves extremities without difficulty  Other:  Pain with palpation of upper nose/forehead, patient states this is where headache is located, grossly normal neurological exam, vision intact   Medical Decision Making  Medically screening exam initiated at 2:15 PM.  Appropriate orders placed.  Gloria Ochoa was informed that the remainder of the evaluation will be completed by another provider, this initial triage assessment does not replace that evaluation, and the importance of remaining in the ED until their evaluation is complete.  Orders include tylenol , CBC, CMP - declined head CT from triage because no red flag symptoms are present, grossly normal neurological exam, patient otherwise healthy and young patient    Fonda Hymen, [place] 02/14/24 1433

## 2024-02-17 ENCOUNTER — Ambulatory Visit (INDEPENDENT_AMBULATORY_CARE_PROVIDER_SITE_OTHER): Admitting: Family

## 2024-02-17 VITALS — BP 110/84 | HR 66 | Temp 97.7°F | Ht 68.0 in | Wt 230.2 lb

## 2024-02-17 DIAGNOSIS — F419 Anxiety disorder, unspecified: Secondary | ICD-10-CM | POA: Diagnosis not present

## 2024-02-17 DIAGNOSIS — R519 Headache, unspecified: Secondary | ICD-10-CM | POA: Diagnosis not present

## 2024-02-17 DIAGNOSIS — F32A Depression, unspecified: Secondary | ICD-10-CM | POA: Diagnosis not present

## 2024-02-17 MED ORDER — NAPROXEN 500 MG PO TABS: 500.0000 mg | ORAL_TABLET | Freq: Two times a day (BID) | ORAL | 1 refills | Status: AC | PRN

## 2024-02-17 MED ORDER — PROPRANOLOL HCL 10 MG PO TABS: 10.0000 mg | ORAL_TABLET | Freq: Three times a day (TID) | ORAL | 0 refills | Status: AC | PRN

## 2024-02-17 NOTE — Assessment & Plan Note (Signed)
 Severe right-sided headaches with nausea, likely migraines. Considered stress, anxiety, dehydration, meal skipping, allergies, and mold exposure as differential diagnoses. Propranolol considered for headache prevention and anxiety management. - Prescribed propranolol for headache prevention and anxiety management. advised on use & possible SE. - Advised lifestyle modifications: adequate hydration (2-2.5 liters daily) and regular meals. - Recommended monitoring for allergens or mold in work or living environment. - Scheduled follow-up in one month to assess headache management and well-being.

## 2024-02-17 NOTE — Progress Notes (Signed)
 New Patient Office Visit  Subjective:  Patient ID: Gloria Ochoa, female    DOB: 10-25-2000  Age: 23 y.o. MRN: [id number]  CC:  Chief Complaint  Patient presents with   New Patient (Initial Visit)    States she has been having headaches. Started on May 30th, and June 6th. Not every day just each of the Last two Fridays.    HPI [name] presents for establishing care today.  Discussed the use of AI scribe software for clinical note transcription with the patient, who gave verbal consent to proceed.  History of Present Illness Gloria Ochoa is a 23 year old female who presents with new onset headaches.  Headaches began on May 30th all day and again this last Friday, affecting mostly right side of her head, and eventually moving to entire head and accompanied by nausea without vomiting. Pain worsens throughout the day, starting mildly upon waking and intensifying by midday. Excedrin Migraine and BC powder provided no relief. Severity forced her to leave work early and rest, but the headache persisted after a two-hour nap. No headache symptoms are present today.  Headaches occurred at the end of her menstrual cycle. She has a Nexplanon  implant since May 2023, with no cycles or mild spotting. No prior headaches but has experienced stomach problems related to her menstrual cycle before pregnancy.  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 She feels increased stress balancing work, school, and caring for her two-year-old daughter. Recently moved to an older apartment in March.  Assessment & Plan New onset headache - Severe right-sided headaches with nausea, likely migraines. Considered stress, anxiety, dehydration, meal skipping, allergies, and mold exposure as differential diagnoses. Propranolol considered for headache prevention and anxiety management. - Prescribed propranolol for headache prevention and  anxiety management. advised on use & possible SE. - Advised lifestyle modifications: adequate hydration (2-2.5 liters daily) and regular meals. - Recommended monitoring for allergens or mold in work or living environment. - Scheduled follow-up in one month to assess headache management and well-being.  Anxiety and depression -  Elevated GAD & PHQ scores today. Feeling more stress with raising child, going to school and working FT. Discussed medicine options prn. - Sending Propranolol 10mg  tid prn for anxiety and can also help with HA prevention. - Advised on other modalities to control stress including exercise, yoga, meditation, or deep breathing exercises/ - F/U in 1 month  Subjective:     Outpatient Medications Prior to Visit  Medication Sig Dispense Refill   ondansetron  (ZOFRAN -ODT) 4 MG disintegrating tablet 4mg  ODT q4 hours prn nausea/vomit 20 tablet 0   promethazine  (PHENERGAN ) 25 MG suppository Place 1 suppository (25 mg total) rectally every 6 (six) hours as needed for nausea or vomiting. 12 each 0   No facility-administered medications prior to visit.   No past medical history on file. No past surgical history on file.  Objective:   Today's Vitals: BP 110/84   Pulse 66   Temp 97.7 F (36.5 C) (Temporal)   Ht 5\' 8"  (1.727 m)   Wt 230 lb 3.2 oz (104.4 kg)   SpO2 99%   BMI 35.00 kg/m   Physical Exam Vitals and nursing note reviewed.  Constitutional:      Appearance: Normal appearance.  Cardiovascular:     Rate and Rhythm: Normal rate and regular rhythm.  Pulmonary:     Effort: Pulmonary effort is normal.  Breath sounds: Normal breath sounds.  Musculoskeletal:        General: Normal range of motion.  Skin:    General: Skin is warm and dry.  Neurological:     Mental Status: She is alert.  Psychiatric:        Mood and Affect: Mood normal.        Behavior: Behavior normal.     No orders of the defined types were placed in this encounter.   [place]a [place], NP

## 2024-02-17 NOTE — Assessment & Plan Note (Signed)
 Elevated GAD & PHQ scores today. Feeling more stress with raising child, going to school and working FT. Discussed medicine options prn. - Sending Propranolol 10mg  tid prn for anxiety and can also help with HA prevention. - Advised on other modalities to control stress including exercise, yoga, meditation, or deep breathing exercises/ - F/U in 1 month

## 2024-02-17 NOTE — Patient Instructions (Signed)
 Welcome to [facility], It was a pleasure meeting you today!    As discussed, I have sent the Propranolol to help with anxiety as needed and also can help prevent your headaches, to your pharmacy. I am also sending Naproxen to take as needed for the headache.  Please schedule a 1 month follow up visit today.    PLEASE NOTE: If you had any LAB tests please let us  know if you have not heard back within a few days. You may see your results on MyChart before we have a chance to review them but we will give you a call once they are reviewed by us . If we ordered any REFERRALS today, please let us  know if you have not heard from their office within the next week.  Let us  know through MyChart if you are needing REFILLS, or have your pharmacy send us  the request. You can also use MyChart to communicate with me or any office staff.  Please try these tips to maintain a healthy lifestyle: It is important that you exercise regularly at least 30 minutes 5 times a week. Think about what you will eat, plan ahead. Choose whole foods, & think  "clean, green, fresh or frozen" over canned, processed or packaged foods which are more sugary, salty, and fatty. 70 to 75% of food eaten should be fresh vegetables and protein. 2-3  meals daily with healthy snacks between meals, but must be whole fruit, protein or vegetables. Aim to eat over a 10 hour period when you are active, for example, 7am to 5pm, and then STOP after your last meal of the day, drinking only water.  Shorter eating windows, 6-8 hours, are showing benefits in heart disease and blood sugar regulation. Drink water every day! Shoot for 64 ounces daily = 8 cups, no other drink is as healthy! Fruit juice is best enjoyed in a healthy way, by EATING the fruit.

## 2024-05-01 ENCOUNTER — Other Ambulatory Visit: Payer: Self-pay

## 2024-05-01 ENCOUNTER — Other Ambulatory Visit (HOSPITAL_COMMUNITY): Payer: Self-pay

## 2024-05-01 ENCOUNTER — Emergency Department (HOSPITAL_COMMUNITY)
Admission: EM | Admit: 2024-05-01 | Discharge: 2024-05-01 | Disposition: A | Discharge status: Home / Self Care | Attending: Emergency Medicine | Admitting: Emergency Medicine

## 2024-05-01 ENCOUNTER — Encounter (HOSPITAL_COMMUNITY): Payer: Self-pay | Admitting: Emergency Medicine

## 2024-05-01 DIAGNOSIS — N390 Urinary tract infection, site not specified: Secondary | ICD-10-CM | POA: Insufficient documentation

## 2024-05-01 DIAGNOSIS — R300 Dysuria: Secondary | ICD-10-CM | POA: Diagnosis present

## 2024-05-01 LAB — URINALYSIS, ROUTINE W REFLEX MICROSCOPIC
Bilirubin Urine: NEGATIVE
Glucose, UA: NEGATIVE mg/dL
Ketones, ur: NEGATIVE mg/dL
Nitrite: NEGATIVE
Protein, ur: 100 mg/dL — AB
RBC / HPF: >50 RBC/hpf (ref 0–5)
Specific Gravity, Urine: 1.027 (ref 1.005–1.030)
WBC, UA: >50 WBC/hpf (ref 0–5)
pH: 5 (ref 5.0–8.0)

## 2024-05-01 MED ORDER — NITROFURANTOIN MONOHYD MACRO 100 MG PO CAPS
100.0000 mg | ORAL_CAPSULE | Freq: Two times a day (BID) | ORAL | 0 refills | Status: DC
  Filled 2024-05-01: qty 10, 5d supply, fill #0

## 2024-05-01 MED ORDER — PHENAZOPYRIDINE HCL 200 MG PO TABS: 200.0000 mg | ORAL_TABLET | Freq: Three times a day (TID) | ORAL | 0 refills | Status: AC

## 2024-05-01 MED ORDER — NITROFURANTOIN MONOHYD MACRO 100 MG PO CAPS: 100.0000 mg | ORAL_CAPSULE | Freq: Two times a day (BID) | ORAL | 0 refills | Status: AC

## 2024-05-01 NOTE — ED Provider Notes (Addendum)
  EMERGENCY DEPARTMENT AT [facility] Provider Note   CSN: [id number] Arrival date & time: 05/01/24  9059     Patient presents with: Dysuria   Gloria Ochoa is a 23 y.o. female.    Dysuria  Patient is a 23 year old female no pertinent past medical history presented emergency room today with complaint of 1 week of hesitancy frequency and informs me that today she began noticing some dysuria.  No fevers or flank pain no nausea or vomiting.  She denies any significant abdominal pain.  Denies any vaginal discharge or blood in her stool.    Prior to Admission medications   Medication Sig Start Date End Date Taking? Authorizing Provider  nitrofurantoin , macrocrystal-monohydrate, (MACROBID ) 100 MG capsule Take 1 capsule (100 mg total) by mouth 2 (two) times daily. 05/01/24  Yes Madine Sarr S, PA  phenazopyridine  (PYRIDIUM ) 200 MG tablet Take 1 tablet (200 mg total) by mouth 3 (three) times daily. 05/01/24  Yes Areonna Bran S, PA  etonogestrel  (NEXPLANON ) 68 MG IMPL implant 1 each by Subdermal route once. 01/27/22   [provider]  naproxen  (NAPROSYN ) 500 MG tablet Take 1 tablet (500 mg total) by mouth 2 (two) times daily as needed for moderate pain (pain score 4-6) or headache. 02/17/24   Lucius Krabbe, NP  propranolol  (INDERAL ) 10 MG tablet Take 1-2 tablets (10-20 mg total) by mouth 3 (three) times daily as needed (May cause mild lightheadeness.). Take for anxiety as needed. Also ok to take daily to prevent headaches. 02/17/24   Lucius Krabbe, NP    Allergies: Patient has no known allergies.    Review of Systems  Genitourinary:  Positive for dysuria.    Updated Vital Signs BP 129/79 (BP Location: Left Arm)   Pulse 77   Temp 98.9 F (37.2 C) (Oral)   Resp 17   SpO2 100%   Physical Exam Vitals and nursing note reviewed.  Constitutional:      General: She is not in acute distress. HENT:     Head: Normocephalic and atraumatic.     Nose:  Nose normal.     Mouth/Throat:     Mouth: Mucous membranes are moist.  Eyes:     General: No scleral icterus. Cardiovascular:     Rate and Rhythm: Normal rate and regular rhythm.     Pulses: Normal pulses.     Heart sounds: Normal heart sounds.  Pulmonary:     Effort: Pulmonary effort is normal. No respiratory distress.     Breath sounds: No wheezing.  Abdominal:     Palpations: Abdomen is soft.     Tenderness: There is no abdominal tenderness. There is no right CVA tenderness, left CVA tenderness, guarding or rebound.  Musculoskeletal:     Cervical back: Normal range of motion.     Right lower leg: No edema.     Left lower leg: No edema.  Skin:    General: Skin is warm and dry.     Capillary Refill: Capillary refill takes less than 2 seconds.  Neurological:     Mental Status: She is alert. Mental status is at baseline.  Psychiatric:        Mood and Affect: Mood normal.        Behavior: Behavior normal.     (all labs ordered are listed, but only abnormal results are displayed) Labs Reviewed  URINALYSIS, ROUTINE W REFLEX MICROSCOPIC - Abnormal; Notable for the following components:      Result Value  APPearance HAZY (*)    Hgb urine dipstick MODERATE (*)    Protein, ur 100 (*)    Leukocytes,Ua LARGE (*)    Bacteria, UA RARE (*)    Non Squamous Epithelial 0-5 (*)    All other components within normal limits  URINE CULTURE    EKG: None  Radiology: No results found.   Procedures   Medications Ordered in the ED - No data to display  Clinical Course as of 05/01/24 1145  Fri May 01, 2024  1059 1 week of hesitancy and frequency Dysuria today.  [WF]    Clinical Course User Index [WF] Neldon Hamp RAMAN, [place]                                 Medical Decision Making Amount and/or Complexity of Data Reviewed Labs: ordered.  Risk Prescription drug management.   Patient is a 23 year old female no pertinent past medical history presented emergency room today with  complaint of 1 week of hesitancy frequency and informs me that today she began noticing some dysuria.  No fevers or flank pain no nausea or vomiting.  She denies any significant abdominal pain.  Denies any vaginal discharge or blood in her stool.  Abdominal exam benign.  No CVA tenderness.  Will prescribe Macrobid  and Pyridium .  Return precautions to the emergency room provided.   Final diagnoses:  Lower urinary tract infectious disease    ED Discharge Orders          Ordered    nitrofurantoin , macrocrystal-monohydrate, (MACROBID ) 100 MG capsule  2 times daily,   Status:  Discontinued        05/01/24 1101    nitrofurantoin , macrocrystal-monohydrate, (MACROBID ) 100 MG capsule  2 times daily        05/01/24 1103    phenazopyridine  (PYRIDIUM ) 200 MG tablet  3 times daily        05/01/24 [address] [place], [place] 05/01/24 1144    Neldon Hamp [place], [place] 05/01/24 1145    Dasie Faden, MD 05/01/24 1435

## 2024-05-01 NOTE — ED Triage Notes (Signed)
 Pt reports UTI sx ~1 week, getting worse. Denies back pain.

## 2024-05-01 NOTE — Discharge Instructions (Signed)
Please take all of your antibiotics until finished!   You may develop abdominal discomfort or diarrhea from the antibiotic.  You may help offset this with probiotics which you can buy or get in yogurt. Do not eat  or take the probiotics until 2 hours after your antibiotic.  ° ° ° °

## 2024-05-03 LAB — URINE CULTURE: Culture: 40000 — AB

## 2024-05-04 ENCOUNTER — Telehealth (HOSPITAL_BASED_OUTPATIENT_CLINIC_OR_DEPARTMENT_OTHER): Payer: Self-pay

## 2024-05-04 ENCOUNTER — Telehealth (HOSPITAL_BASED_OUTPATIENT_CLINIC_OR_DEPARTMENT_OTHER): Payer: Self-pay | Admitting: *Deleted

## 2024-05-04 NOTE — Telephone Encounter (Signed)
 Post ED Visit - Positive Culture Follow-up  Culture report reviewed by antimicrobial stewardship pharmacist: [facility] []  Rankin Dee, Pharm.D. []  Venetia Gully, Pharm.D., BCPS AQ-ID []  Garrel Crews, Pharm.D., BCPS []  Almarie Lunger, Pharm.D., BCPS []  [place], [address].D., BCPS, AAHIVP []  Rosaline Bihari, Pharm.D., BCPS, AAHIVP []  Vernell Meier, PharmD, BCPS []  Latanya Hint, PharmD, BCPS []  Donald Medley, PharmD, BCPS []  Rocky Bold, PharmD []  Dorothyann Alert, PharmD, BCPS []  Morene Babe, PharmD  [facility] [x]  Damien Quiet, PharmD []  Romona Bliss, PharmD []  Dolphus Roller, PharmD []  Veva Seip, Rph []  Vernell Daunt) Leonce, PharmD []  Eva Allis, PharmD []  Rosaline Millet, PharmD []  Iantha Batch, PharmD []  Arvin Gauss, PharmD []  Wanda Hasting, PharmD []  Ronal Rav, PharmD []  Rocky Slade, PharmD []  Bard Jeans, PharmD   Positive urine culture Treated with Nitrofurantoin , organism sensitive to the same and no further patient follow-up is required at this time.  Gloria Ochoa 05/04/2024, 8:28 AM

## 2024-05-04 NOTE — Telephone Encounter (Signed)
 Post ED Visit - Positive Culture Follow-up  Culture report reviewed by antimicrobial stewardship pharmacist: [facility] []  Rankin Dee, Pharm.D. []  Venetia Gully, Pharm.D., BCPS AQ-ID []  Garrel Crews, Pharm.D., BCPS []  Almarie Lunger, [address].D., BCPS []  [place], [address].D., BCPS, AAHIVP []  Rosaline Bihari, Pharm.D., BCPS, AAHIVP []  Vernell Meier, PharmD, BCPS []  Latanya Hint, PharmD, BCPS []  Donald Medley, PharmD, BCPS []  Rocky Bold, PharmD []  Dorothyann Alert, PharmD, BCPS []  Morene Babe, PharmD  [facility] [x]  Damien Quiet PharmD []  Romona Bliss, PharmD []  Dolphus Roller, PharmD []  Veva Seip, Rph []  Vernell Daunt) Leonce, PharmD []  Eva Allis, PharmD []  Rosaline Millet, PharmD []  Iantha Batch, PharmD []  Arvin Gauss, PharmD []  Wanda Hasting, PharmD []  Ronal Rav, PharmD []  Rocky Slade, PharmD []  Bard Jeans, PharmD   Positive Urine culture Treated with Nitrofurantoin  , organism sensitive to the same and no further patient follow-up is required at this time.  Jama Wyman Kipper 05/04/2024, 3:32 PM

## 2024-10-13 ENCOUNTER — Encounter: Admitting: Family

## 2024-10-28 ENCOUNTER — Encounter: Admitting: Family Medicine
